# Patient Record
Sex: Female | Born: 1950 | Race: White | Hispanic: No | Marital: Single | Smoking: Former smoker
Health system: Southern US, Community
[De-identification: ages and names within clinical notes are randomized; demographics above are authoritative.]

## PROBLEM LIST (undated history)

## (undated) DIAGNOSIS — F329 Major depressive disorder, single episode, unspecified: Secondary | ICD-10-CM

## (undated) DIAGNOSIS — F32A Depression, unspecified: Secondary | ICD-10-CM

## (undated) DIAGNOSIS — E785 Hyperlipidemia, unspecified: Secondary | ICD-10-CM

## (undated) DIAGNOSIS — I10 Essential (primary) hypertension: Secondary | ICD-10-CM

## (undated) DIAGNOSIS — R569 Unspecified convulsions: Secondary | ICD-10-CM

## (undated) DIAGNOSIS — G4700 Insomnia, unspecified: Secondary | ICD-10-CM

## (undated) HISTORY — PX: ABDOMINAL HYSTERECTOMY: SHX81

## (undated) HISTORY — PX: BUNIONECTOMY: SHX129

## (undated) HISTORY — PX: ANEURYSM COILING: SHX5349

## (undated) HISTORY — DX: Insomnia, unspecified: G47.00

## (undated) HISTORY — DX: Depression, unspecified: F32.A

## (undated) HISTORY — DX: Major depressive disorder, single episode, unspecified: F32.9

## (undated) HISTORY — PX: TRIGGER FINGER RELEASE: SHX641

---

## 2013-07-27 ENCOUNTER — Encounter: Payer: Self-pay | Admitting: Emergency Medicine

## 2013-07-27 ENCOUNTER — Emergency Department
Admission: EM | Admit: 2013-07-27 | Discharge: 2013-07-27 | Disposition: A | Discharge status: Home / Self Care | Payer: Self-pay | Source: Home / Self Care

## 2013-07-27 DIAGNOSIS — I10 Essential (primary) hypertension: Secondary | ICD-10-CM

## 2013-07-27 DIAGNOSIS — G40909 Epilepsy, unspecified, not intractable, without status epilepticus: Secondary | ICD-10-CM

## 2013-07-27 DIAGNOSIS — E785 Hyperlipidemia, unspecified: Secondary | ICD-10-CM

## 2013-07-27 HISTORY — DX: Essential (primary) hypertension: I10

## 2013-07-27 HISTORY — DX: Hyperlipidemia, unspecified: E78.5

## 2013-07-27 HISTORY — DX: Unspecified convulsions: R56.9

## 2013-07-27 LAB — COMPREHENSIVE METABOLIC PANEL
ALT: 24 U/L (ref 0–35)
Albumin: 4.5 g/dL (ref 3.5–5.2)
BUN: 14 mg/dL (ref 6–23)
CO2: 29 mEq/L (ref 19–32)
Calcium: 10.1 mg/dL (ref 8.4–10.5)
Chloride: 101 mEq/L (ref 96–112)
Glucose, Bld: 84 mg/dL (ref 70–99)
Potassium: 4.7 mEq/L (ref 3.5–5.3)
Sodium: 139 mEq/L (ref 135–145)
Total Bilirubin: 0.6 mg/dL (ref 0.3–1.2)
Total Protein: 7.8 g/dL (ref 6.0–8.3)

## 2013-07-27 LAB — TSH: TSH: 2.719 u[IU]/mL (ref 0.350–4.500)

## 2013-07-27 LAB — HEMOGLOBIN A1C
Hgb A1c MFr Bld: 6.4 % — ABNORMAL HIGH (ref <5.7)
Mean Plasma Glucose: 137 mg/dL — ABNORMAL HIGH (ref <117)

## 2013-07-27 LAB — LIPID PANEL
LDL Cholesterol: 80 mg/dL (ref 0–99)
Triglycerides: 190 mg/dL — ABNORMAL HIGH (ref <150)
VLDL: 38 mg/dL (ref 0–40)

## 2013-07-27 LAB — POCT CBC W AUTO DIFF (K'VILLE URGENT CARE)

## 2013-07-27 MED ORDER — TEMAZEPAM 30 MG PO CAPS: 30.0000 mg | ORAL_CAPSULE | Freq: Every evening | ORAL | Status: DC | PRN

## 2013-07-27 MED ORDER — BENAZEPRIL-HYDROCHLOROTHIAZIDE 5-6.25 MG PO TABS: 1.0000 | ORAL_TABLET | Freq: Every day | ORAL | Status: DC

## 2013-07-27 MED ORDER — LEVETIRACETAM 500 MG PO TABS: 500.0000 mg | ORAL_TABLET | Freq: Two times a day (BID) | ORAL | Status: DC

## 2013-07-27 MED ORDER — NEBIVOLOL HCL 10 MG PO TABS: 10.0000 mg | ORAL_TABLET | Freq: Every day | ORAL | Status: DC

## 2013-07-27 NOTE — ED Provider Notes (Signed)
CSN: [id number]     Arrival date & time 07/27/13  4098 History   None    Chief Complaint  Patient presents with  . Medication Refill    HPI  Patient presents here today for medication refills. Patient recently relocated to the area from [place]. Patient currently has an HMO that is not active in [place] and per the patient, she has to go to urgent care or ER for medication refills. She will get a new pain in January set up primary care at that point.   needs refills on her medicines through the end of December. Baseline history of hypertension, hyperlipidemia, seizure disorder. [place] chest pain shortness of breath. Does have some intermittent mild headaches. Seizure diagnosis was about one year ago via outpatient EEG by neurology and [place]. Has been on Keppra for this. Has not missed any doses. No seizure activity since the initial seizure last year. 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   Past Medical History  Diagnosis Date  . Hypertension   . Hyperlipidemia   . Seizure    Past Surgical History  Procedure Laterality Date  . Abdominal hysterectomy    . Aneurysm coiling     Family History  Problem Relation Age of Onset  . Heart failure Mother   . Transient ischemic attack Mother   . Heart failure Father   . Diabetes Father    History  Substance Use Topics  . Smoking status: Former Games developer  . Smokeless tobacco: Not on file  . Alcohol Use: No   OB History   Grav Para Term Preterm Abortions TAB SAB Ect Mult Living                 Review of Systems  All other systems reviewed and are negative.    Allergies  Review of patient's allergies indicates no known allergies.  Home Medications   Current Outpatient Rx  Name  Route  Sig  Dispense  Refill  . benazepril-hydrochlorthiazide (LOTENSIN HCT) 5-6.25 MG per tablet   Oral   Take 1 tablet by mouth daily.   60 tablet   0   . levETIRAcetam  (KEPPRA) 500 MG tablet   Oral   Take 1 tablet (500 mg total) by mouth 2 (two) times daily.   60 tablet   1   . nebivolol (BYSTOLIC) 10 MG tablet   Oral   Take 1 tablet (10 mg total) by mouth daily.   30 tablet   1   . temazepam (RESTORIL) 30 MG capsule   Oral   Take 1 capsule (30 mg total) by mouth at bedtime as needed for sleep.   30 capsule   0    BP 189/82  Pulse 70  Temp(Src) 98.1 F (36.7 C) (Oral)  Resp 18  Ht 5\' 6"  (1.676 m)  Wt 233 lb (105.688 kg)  BMI 37.63 kg/m2  SpO2 96% Physical Exam  Constitutional: She is oriented to person, place, and time.  Obese   HENT:  Head: Normocephalic and atraumatic.  Eyes: Conjunctivae are normal. Pupils are equal, round, and reactive to light.  Neck: Normal range of motion.  Cardiovascular: Normal rate and regular rhythm.   Pulmonary/Chest: Effort normal and breath sounds normal.  Abdominal: Soft.  Musculoskeletal: Normal range of motion.  Neurological: She is alert and oriented to person, place, and time.  Skin: Skin is warm.    ED Course  Procedures (including  critical care time) Labs Review Labs Reviewed  COMPREHENSIVE METABOLIC PANEL  TSH  HEMOGLOBIN A1C  LIPID PANEL  LEVETIRACETAM LEVEL  POCT CBC W AUTO DIFF ([facility])   Imaging Review No results found.  EKG Interpretation    Date/Time:    Ventricular Rate:    PR Interval:    QRS Duration:   QT Interval:    QTC Calculation:   R Axis:     Text Interpretation:              MDM   1. HTN (hypertension)   2. HLD (hyperlipidemia)   3. Seizure disorder    Medications refilled today.  Will double dose of benazapril-HCTZ given BP.  Check baseline labs including CBC, CMET, A1C, TSH, lipid panel.  Also check keppra level.  Discussed neurovascular red flags at length with pt.  Set up primary care at beginning of the year.  Check BP at home.  Go to ER if pt develops any significant neurovascular sxs.      The patient and/or  caregiver has been counseled thoroughly with regard to treatment plan and/or medications prescribed including dosage, schedule, interactions, rationale for use, and possible side effects and they verbalize understanding. Diagnoses and expected course of recovery discussed and will return if not improved as expected or if the condition worsens. Patient and/or caregiver verbalized understanding.          Doree Albee, MD 07/27/13 1032

## 2013-07-27 NOTE — ED Notes (Signed)
The pt is here today for a refill of her medications. She is going to schedule an appt to establish a PCP, she recently moved here from [place].

## 2013-09-13 ENCOUNTER — Ambulatory Visit (INDEPENDENT_AMBULATORY_CARE_PROVIDER_SITE_OTHER): Payer: BC Managed Care – PPO | Admitting: Physician Assistant

## 2013-09-13 ENCOUNTER — Encounter: Payer: Self-pay | Admitting: Physician Assistant

## 2013-09-13 VITALS — BP 138/90 | HR 92 | Ht 66.0 in | Wt 236.0 lb

## 2013-09-13 DIAGNOSIS — F32A Depression, unspecified: Secondary | ICD-10-CM | POA: Insufficient documentation

## 2013-09-13 DIAGNOSIS — N898 Other specified noninflammatory disorders of vagina: Secondary | ICD-10-CM

## 2013-09-13 DIAGNOSIS — G4700 Insomnia, unspecified: Secondary | ICD-10-CM

## 2013-09-13 DIAGNOSIS — R569 Unspecified convulsions: Secondary | ICD-10-CM

## 2013-09-13 DIAGNOSIS — J019 Acute sinusitis, unspecified: Secondary | ICD-10-CM

## 2013-09-13 DIAGNOSIS — E785 Hyperlipidemia, unspecified: Secondary | ICD-10-CM

## 2013-09-13 DIAGNOSIS — Z1239 Encounter for other screening for malignant neoplasm of breast: Secondary | ICD-10-CM

## 2013-09-13 DIAGNOSIS — F3289 Other specified depressive episodes: Secondary | ICD-10-CM

## 2013-09-13 DIAGNOSIS — F329 Major depressive disorder, single episode, unspecified: Secondary | ICD-10-CM

## 2013-09-13 DIAGNOSIS — I10 Essential (primary) hypertension: Secondary | ICD-10-CM

## 2013-09-13 LAB — WET PREP FOR TRICH, YEAST, CLUE
Trich, Wet Prep: NONE SEEN
YEAST WET PREP: NONE SEEN

## 2013-09-13 MED ORDER — DOXYCYCLINE HYCLATE 100 MG PO CAPS: 100.0000 mg | ORAL_CAPSULE | Freq: Two times a day (BID) | ORAL | Status: DC

## 2013-09-13 MED ORDER — BENAZEPRIL-HYDROCHLOROTHIAZIDE 10-12.5 MG PO TABS: 1.0000 | ORAL_TABLET | Freq: Every day | ORAL | Status: DC

## 2013-09-13 NOTE — Patient Instructions (Signed)
Wash Dial soap.   Hidradenitis Suppurativa, Sweat Gland Abscess Hidradenitis suppurativa is a long lasting (chronic), uncommon disease of the sweat glands. With this, boil-like lumps and scarring develop in the groin, some times under the arms (axillae), and under the breasts. It may also uncommonly occur behind the ears, in the crease of the buttocks, and around the genitals.  CAUSES  The cause is from a blocking of the sweat glands. They then become infected. It may cause drainage and odor. It is not contagious. So it cannot be given to someone else. It most often shows up in puberty (about 6410 to 61 years of age). But it may happen much later. It is similar to acne which is a disease of the sweat glands. This condition is slightly more common in African-Americans and women. SYMPTOMS   Hidradenitis usually starts as one or more red, tender, swellings in the groin or under the arms (axilla).  Over a period of hours to days the lesions get larger. They often open to the skin surface, draining clear to yellow-colored fluid.  The infected area heals with scarring. DIAGNOSIS  Your caregiver makes this diagnosis by looking at you. Sometimes cultures (growing germs on plates in the lab) may be taken. This is to see what germ (bacterium) is causing the infection.  TREATMENT   Topical germ killing medicine applied to the skin (antibiotics) are the treatment of choice. Antibiotics taken by mouth (systemic) are sometimes needed when the condition is getting worse or is severe.  Avoid tight-fitting clothing which traps moisture in.  Dirt does not cause hidradenitis and it is not caused by poor hygiene.  Involved areas should be cleaned daily using an antibacterial soap. Some patients find that the liquid form of Lever 2000, applied to the involved areas as a lotion after bathing, can help reduce the odor related to this condition.  Sometimes surgery is needed to drain infected areas or remove scarred  tissue. Removal of large amounts of tissue is used only in severe cases.  Birth control pills may be helpful.  Oral retinoids (vitamin A derivatives) for 6 to 12 months which are effective for acne may also help this condition.  Weight loss will improve but not cure hidradenitis. It is made worse by being overweight. But the condition is not caused by being overweight.  This condition is more common in people who have had acne.  It may become worse under stress. There is no medical cure for hidradenitis. It can be controlled, but not cured. The condition usually continues for years with periods of getting worse and getting better (remission). Document Released: 04/07/2004 Document Revised: 11/16/2011 Document Reviewed: 04/23/2008 [facility] Patient Information 2014 [employer], [place].

## 2013-09-14 ENCOUNTER — Other Ambulatory Visit: Payer: Self-pay | Admitting: Physician Assistant

## 2013-09-14 MED ORDER — METRONIDAZOLE 500 MG PO TABS: 500.0000 mg | ORAL_TABLET | Freq: Two times a day (BID) | ORAL | Status: DC

## 2013-09-15 ENCOUNTER — Encounter: Payer: Self-pay | Admitting: Physician Assistant

## 2013-09-15 NOTE — Patient Instructions (Addendum)

## 2013-09-15 NOTE — Progress Notes (Signed)
   Subjective:    Patient ID: Hayley Porter, female    DOB: 12/29/1950, 63 y.o.   MRN: [id number]  HPI Pt is a 63 yo WF who presents to the clinic to establish care.  . Active Ambulatory Problems    Diagnosis Date Noted  . Insomnia 09/13/2013  . Depression 09/13/2013  . Seizures 09/13/2013  . Essential hypertension, benign 09/13/2013  . Hyperlipidemia 09/13/2013   Resolved Ambulatory Problems    Diagnosis Date Noted  . No Resolved Ambulatory Problems   Past Medical History  Diagnosis Date  . Hypertension   . Seizure    HTN- pt started on lotensin at urgent care. Denies any CP, palpitations, vision changes.   Depression- well controlled on zoloft.   Seizures- controlled on keppra needs new neurologist.   Hyperlipidemia- not had checked in a while. On crestor.   Insomnia- controlled on restoril.   Vaginal discharge for 3 days. Fish odor. No dysuria, urinary frequency or urgency. Some vaginal itching.  Sinus pressure ongoing for 1 month. Teeth ache. Negative for fever, chills, SOB, wheezing, nausea, vomiting. Not tried anything to make better. HA's have been more frequent. No cough.   Review of Systems  All other systems reviewed and are negative.       Objective:   Physical Exam  Constitutional: She is oriented to person, place, and time. She appears well-developed and well-nourished.  HENT:  Head: Normocephalic and atraumatic.  Eyes: Conjunctivae are normal.  Cardiovascular: Normal rate, regular rhythm and normal heart sounds.   Pulmonary/Chest: Effort normal and breath sounds normal. She has no wheezes.  No CVA tenderness.   Neurological: She is alert and oriented to person, place, and time.  Skin: Skin is warm and dry.  Psychiatric: She has a normal mood and affect. Her behavior is normal.          Assessment & Plan:  HTN- increased lotensin sent new dose to pharmacy. Follow up in 1-2 month for CPE and BP recheck. BP was better with 2nd check. Call if  becomes dizzy. Check BP and make sure staying under 140/90.   Hyperlipidemia- Will recheck Lipid level at CPE. Refilled crestor for time being.   Insomnia- refilled restoril.   Depression- Refilled zoloft.Depression screening- PHQ-9 was 5.    Seizures- will refer to neurologist for management.   Vaginal discharge- stat wet prep positive for BV. Treated with metronidzole. Gave HO.   Acute sinusitis- treated with doxycycline for 10 days. HO given. Call if not improving.   Needs mammogram.

## 2013-09-19 ENCOUNTER — Emergency Department
Admission: EM | Admit: 2013-09-19 | Discharge: 2013-09-19 | Disposition: A | Discharge status: Home / Self Care | Payer: BC Managed Care – PPO | Source: Home / Self Care | Attending: Emergency Medicine | Admitting: Emergency Medicine

## 2013-09-19 ENCOUNTER — Telehealth: Payer: Self-pay | Admitting: Physician Assistant

## 2013-09-19 ENCOUNTER — Encounter: Payer: Self-pay | Admitting: Emergency Medicine

## 2013-09-19 DIAGNOSIS — J0100 Acute maxillary sinusitis, unspecified: Secondary | ICD-10-CM

## 2013-09-19 MED ORDER — BENZONATATE 100 MG PO CAPS: 100.0000 mg | ORAL_CAPSULE | Freq: Three times a day (TID) | ORAL | Status: DC

## 2013-09-19 MED ORDER — AMOXICILLIN 875 MG PO TABS: ORAL_TABLET | ORAL | Status: DC

## 2013-09-19 MED ORDER — FLUTICASONE PROPIONATE 50 MCG/ACT NA SUSP: NASAL | Status: DC

## 2013-09-19 NOTE — ED Provider Notes (Signed)
CSN: [id number]     Arrival date & time 09/19/13  0932 History   First MD Initiated Contact with Patient 09/19/13 [phone]     Chief Complaint  Patient presents with  . URI  . Nasal Congestion  . Shortness of Breath   (Consider location/radiation/quality/duration/timing/severity/associated sxs/prior Treatment) HPI Jakyiah c/o congestion, runny nose, dry cough,  HA and sweats x 2-3 days. Taken Theraflu, robitussin and alkaseltzer plus otc. Received flu vac this season.      URI HISTORY  Jannifer is a 63 y.o. female who complains of onset of cold symptoms for 3 days.  Have been using over-the-counter treatment which helps a little bit.  mild chills/sweats +  Fever Mild body aches.--No arthralgias  +  Nasal congestion +  Discolored Post-nasal drainage, yellow-green, slight tinge blood Mild sinus pain/pressure No sore throat  +  Cough, nonproductive Denies wheezing No chest congestion No hemoptysis I questioned her further about breathing, and she denies shortness of breath No pleuritic pain  No itchy/red eyes No earache, but some bilateral ear pressure  No nausea No vomiting No abdominal pain No diarrhea  No skin rashes +  Fatigue mild myalgias Mild headache , denies focal neurologic symptoms or any visual change   Past Medical History  Diagnosis Date  . Hypertension   . Hyperlipidemia   . Seizure   . Insomnia   . Depression    Past Surgical History  Procedure Laterality Date  . Abdominal hysterectomy    . Aneurysm coiling    . Bunionectomy Right   . Trigger finger release Left     thumb   Family History  Problem Relation Age of Onset  . Heart failure Mother   . Transient ischemic attack Mother   . Hypertension Mother   . Hyperlipidemia Mother   . Heart failure Father   . Diabetes Father    History  Substance Use Topics  . Smoking status: Former Smoker    Quit date: 09/19/1993  . Smokeless tobacco: Never Used  . Alcohol Use: No   OB History   Grav Para Term Preterm Abortions TAB SAB Ect Mult Living                 Review of Systems  All other systems reviewed and are negative.    Allergies  Review of patient's allergies indicates no known allergies.  Home Medications   Current Outpatient Rx  Name  Route  Sig  Dispense  Refill  . amoxicillin (AMOXIL) 875 MG tablet      Take 1 twice a day X 10 days.   20 tablet   0   . aspirin 81 MG tablet   Oral   Take 81 mg by mouth daily.         . benazepril-hydrochlorthiazide (LOTENSIN HCT) 10-12.5 MG per tablet   Oral   Take 1 tablet by mouth daily.   90 tablet   0   . benzonatate (TESSALON) 100 MG capsule   Oral   Take 1 capsule (100 mg total) by mouth every 8 (eight) hours. As needed for cough   21 capsule   0   . doxycycline (VIBRAMYCIN) 100 MG capsule   Oral   Take 1 capsule (100 mg total) by mouth 2 (two) times daily. Take for 10 days.   20 capsule   0   . levETIRAcetam (KEPPRA) 500 MG tablet   Oral   Take 1 tablet (500 mg total) by mouth 2 (two) times  daily.   60 tablet   1   . metroNIDAZOLE (FLAGYL) 500 MG tablet   Oral   Take 1 tablet (500 mg total) by mouth 2 (two) times daily. For 7 days.   14 tablet   0   . Multiple Vitamin (MULTIVITAMIN) tablet   Oral   Take 1 tablet by mouth daily.         . nebivolol (BYSTOLIC) 10 MG tablet   Oral   Take 1 tablet (10 mg total) by mouth daily.   30 tablet   1   . rosuvastatin (CRESTOR) 10 MG tablet   Oral   Take 10 mg by mouth daily.         . sertraline (ZOLOFT) 100 MG tablet   Oral   Take 100 mg by mouth daily.         . temazepam (RESTORIL) 30 MG capsule   Oral   Take 1 capsule (30 mg total) by mouth at bedtime as needed for sleep.   30 capsule   0   . Vitamin D, Ergocalciferol, (DRISDOL) 50000 UNITS CAPS capsule   Oral   Take 50,000 Units by mouth every 30 (thirty) days.          BP 174/93  Pulse 97  Temp(Src) 99.4 F (37.4 C) (Oral)  Resp 14  Wt 238 lb (107.956 kg)   SpO2 95% Physical Exam  Nursing note and vitals reviewed. Constitutional: She is oriented to person, place, and time. She appears well-developed and well-nourished. No distress.  HENT:  Head: Normocephalic and atraumatic.  Right Ear: Tympanic membrane, external ear and ear canal normal.  Left Ear: Tympanic membrane, external ear and ear canal normal.  Nose: Mucosal edema and rhinorrhea present. Right sinus exhibits maxillary sinus tenderness. Left sinus exhibits maxillary sinus tenderness.  Mouth/Throat: Oropharynx is clear and moist. No oral lesions. No oropharyngeal exudate.  Eyes: Right eye exhibits no discharge. Left eye exhibits no discharge. No scleral icterus.  Neck: Neck supple.  Cardiovascular: Normal rate, regular rhythm and normal heart sounds.   Pulmonary/Chest: Effort normal and breath sounds normal. She has no wheezes. She has no rales.  Lymphadenopathy:    She has no cervical adenopathy.  Neurological: She is alert and oriented to person, place, and time.  Skin: Skin is warm and dry.    ED Course  Procedures (including critical care time) Labs Review Labs Reviewed - No data to display Imaging Review No results found.  EKG Interpretation    Date/Time:    Ventricular Rate:    PR Interval:    QRS Duration:   QT Interval:    QTC Calculation:   R Axis:     Text Interpretation:              MDM   1. Acute maxillary sinusitis    Treatment options discussed, as well as risks, benefits, alternatives. Patient voiced understanding and agreement with the following plans:  Amoxicillin 875 twice a day x10 days Tessalon Perles every 8 hours as needed for cough Plain OTC Mucinex, advised to avoid decongestants which can raise BP. Flonase prescribed Follow-up with your primary care doctor in 5-7 days if not improving, or sooner if symptoms become worse. Also advised to followup with PCP within 2 weeks to have BP rechecked. Precautions discussed. Red flags  discussed. Questions invited and answered. Patient voiced understanding and agreement.     Lajean Manesavid Massey, MD 09/19/13 1031

## 2013-09-19 NOTE — Telephone Encounter (Signed)
Call pt: I see that she went to urgent care today and got augmentin. I had put her on doxy when she was here last Wednesday. Make sure not taking both.

## 2013-09-19 NOTE — ED Notes (Signed)
Hayley Porter c/o congestion, runny nose, dry cough, SOB @ rest, HA and sweats x 2-3 days. Taken Theraflu, robitussin and alkaseltzer plus otc. Received flu vac this season.

## 2013-09-20 NOTE — Telephone Encounter (Signed)
LMOM for pt to return call. 

## 2013-09-26 ENCOUNTER — Ambulatory Visit: Payer: BC Managed Care – PPO

## 2013-10-02 ENCOUNTER — Other Ambulatory Visit: Payer: Self-pay | Admitting: Family Medicine

## 2013-10-03 ENCOUNTER — Ambulatory Visit (INDEPENDENT_AMBULATORY_CARE_PROVIDER_SITE_OTHER): Payer: BC Managed Care – PPO

## 2013-10-03 ENCOUNTER — Other Ambulatory Visit: Payer: Self-pay | Admitting: *Deleted

## 2013-10-03 DIAGNOSIS — R928 Other abnormal and inconclusive findings on diagnostic imaging of breast: Secondary | ICD-10-CM

## 2013-10-03 DIAGNOSIS — Z1231 Encounter for screening mammogram for malignant neoplasm of breast: Secondary | ICD-10-CM

## 2013-10-03 MED ORDER — LEVETIRACETAM 500 MG PO TABS: 500.0000 mg | ORAL_TABLET | Freq: Two times a day (BID) | ORAL | Status: DC

## 2013-10-03 NOTE — Telephone Encounter (Signed)
#  60 of Keppra sent with no refills to [facility].

## 2013-10-03 NOTE — Telephone Encounter (Signed)
Patient walked-in request to have a refill of Levetiracetam 500mg  [facility] [place]. Alvester Mor[place] in urgent care filled it for her but she is completely out and need Jade to refill it. Thanks

## 2013-10-09 ENCOUNTER — Encounter: Payer: Self-pay | Admitting: Physician Assistant

## 2013-10-10 ENCOUNTER — Encounter: Payer: Self-pay | Admitting: Physician Assistant

## 2013-10-10 DIAGNOSIS — R928 Other abnormal and inconclusive findings on diagnostic imaging of breast: Secondary | ICD-10-CM | POA: Insufficient documentation

## 2013-10-16 ENCOUNTER — Other Ambulatory Visit: Payer: Self-pay | Admitting: Physician Assistant

## 2013-10-16 ENCOUNTER — Other Ambulatory Visit: Payer: Self-pay | Admitting: *Deleted

## 2013-10-16 MED ORDER — TEMAZEPAM 30 MG PO CAPS: 30.0000 mg | ORAL_CAPSULE | Freq: Every evening | ORAL | Status: DC | PRN

## 2013-10-16 MED ORDER — NEBIVOLOL HCL 10 MG PO TABS: 10.0000 mg | ORAL_TABLET | Freq: Every day | ORAL | Status: DC

## 2013-10-18 ENCOUNTER — Other Ambulatory Visit: Payer: Self-pay | Admitting: Physician Assistant

## 2013-10-18 DIAGNOSIS — R928 Other abnormal and inconclusive findings on diagnostic imaging of breast: Secondary | ICD-10-CM

## 2013-10-30 ENCOUNTER — Ambulatory Visit
Admission: RE | Admit: 2013-10-30 | Discharge: 2013-10-30 | Disposition: A | Discharge status: Home / Self Care | Payer: BC Managed Care – PPO | Source: Ambulatory Visit | Attending: Physician Assistant | Admitting: Physician Assistant

## 2013-10-30 DIAGNOSIS — R928 Other abnormal and inconclusive findings on diagnostic imaging of breast: Secondary | ICD-10-CM

## 2013-10-31 ENCOUNTER — Ambulatory Visit: Payer: BC Managed Care – PPO | Admitting: Physician Assistant

## 2013-11-01 ENCOUNTER — Ambulatory Visit (INDEPENDENT_AMBULATORY_CARE_PROVIDER_SITE_OTHER): Payer: BC Managed Care – PPO | Admitting: Physician Assistant

## 2013-11-01 ENCOUNTER — Encounter: Payer: Self-pay | Admitting: Physician Assistant

## 2013-11-01 VITALS — BP 187/87 | HR 80 | Temp 98.3°F | Wt 237.0 lb

## 2013-11-01 DIAGNOSIS — R05 Cough: Secondary | ICD-10-CM

## 2013-11-01 DIAGNOSIS — R062 Wheezing: Secondary | ICD-10-CM

## 2013-11-01 DIAGNOSIS — J329 Chronic sinusitis, unspecified: Secondary | ICD-10-CM

## 2013-11-01 DIAGNOSIS — I10 Essential (primary) hypertension: Secondary | ICD-10-CM

## 2013-11-01 DIAGNOSIS — R059 Cough, unspecified: Secondary | ICD-10-CM

## 2013-11-01 DIAGNOSIS — J45909 Unspecified asthma, uncomplicated: Secondary | ICD-10-CM

## 2013-11-01 MED ORDER — ALBUTEROL SULFATE HFA 108 (90 BASE) MCG/ACT IN AERS: 2.0000 | INHALATION_SPRAY | Freq: Four times a day (QID) | RESPIRATORY_TRACT | Status: DC | PRN

## 2013-11-01 MED ORDER — IPRATROPIUM-ALBUTEROL 0.5-2.5 (3) MG/3ML IN SOLN
3.0000 mL | Freq: Once | RESPIRATORY_TRACT | Status: AC
  Administered 2013-11-01: 3 mL via RESPIRATORY_TRACT

## 2013-11-01 MED ORDER — PREDNISONE 50 MG PO TABS: ORAL_TABLET | ORAL | Status: DC

## 2013-11-01 MED ORDER — AZITHROMYCIN 250 MG PO TABS: ORAL_TABLET | ORAL | Status: DC

## 2013-11-01 NOTE — Patient Instructions (Addendum)
Increase Lotensin to 2 tabs daily of 10/12.5 to equal 20/25. Follow up month.   Acute Bronchitis Bronchitis is inflammation of the airways that extend from the windpipe into the lungs (bronchi). The inflammation often causes mucus to develop. This leads to a cough, which is the most common symptom of bronchitis.  In acute bronchitis, the condition usually develops suddenly and goes away over time, usually in a couple weeks. Smoking, allergies, and asthma can make bronchitis worse. Repeated episodes of bronchitis may cause further lung problems.  CAUSES Acute bronchitis is most often caused by the same virus that causes a cold. The virus can spread from person to person (contagious).  SIGNS AND SYMPTOMS   Cough.   Fever.   Coughing up mucus.   Body aches.   Chest congestion.   Chills.   Shortness of breath.   Sore throat.  DIAGNOSIS  Acute bronchitis is usually diagnosed through a physical exam. Tests, such as chest X-rays, are sometimes done to rule out other conditions.  TREATMENT  Acute bronchitis usually goes away in a couple weeks. Often times, no medical treatment is necessary. Medicines are sometimes given for relief of fever or cough. Antibiotics are usually not needed but may be prescribed in certain situations. In some cases, an inhaler may be recommended to help reduce shortness of breath and control the cough. A cool mist vaporizer may also be used to help thin bronchial secretions and make it easier to clear the chest.  HOME CARE INSTRUCTIONS  Get plenty of rest.   Drink enough fluids to keep your urine clear or pale yellow (unless you have a medical condition that requires fluid restriction). Increasing fluids may help thin your secretions and will prevent dehydration.   Only take over-the-counter or prescription medicines as directed by your health care provider.   Avoid smoking and secondhand smoke. Exposure to cigarette smoke or irritating chemicals will  make bronchitis worse. If you are a smoker, consider using nicotine gum or skin patches to help control withdrawal symptoms. Quitting smoking will help your lungs heal faster.   Reduce the chances of another bout of acute bronchitis by washing your hands frequently, avoiding people with cold symptoms, and trying not to touch your hands to your mouth, nose, or eyes.   Follow up with your health care provider as directed.  SEEK MEDICAL CARE IF: Your symptoms do not improve after 1 week of treatment.  SEEK IMMEDIATE MEDICAL CARE IF:  You develop an increased fever or chills.   You have chest pain.   You have severe shortness of breath.  You have bloody sputum.   You develop dehydration.  You develop fainting.  You develop repeated vomiting.  You develop a severe headache. MAKE SURE YOU:   Understand these instructions.  Will watch your condition.  Will get help right away if you are not doing well or get worse. Document Released: 10/01/2004 Document Revised: 04/26/2013 Document Reviewed: 02/14/2013 [facility] Patient Information 2014 [place], [place].

## 2013-11-01 NOTE — Progress Notes (Signed)
   Subjective:    Patient ID: Hayley Porter, female    DOB: 09/18/1950, 63 y.o.   MRN: [id number]  HPI Pt presents to the clinic with 4 days of HA, dry cough, sinus pressure, wheezing. Pt has no hx of lung disease or asthma. She denies any fever, chills, nausea, vomiting, diarrhea. She has tried [employer]. Donnelleygoody powders, tylenol, theraflu with no relief. Denies any body aches.    Review of Systems     Objective:   Physical Exam  Constitutional: She is oriented to person, place, and time. She appears well-developed and well-nourished.  HENT:  Head: Normocephalic and atraumatic.  Right Ear: External ear normal.  Left Ear: External ear normal.  Nose: Nose normal.  Mouth/Throat: Oropharynx is clear and moist.  TM's clear bilaterally.   Maxillary sinus pressure bilaterally.   Eyes: Conjunctivae are normal.  Neck: Normal range of motion. Neck supple.  Cardiovascular: Normal rate, regular rhythm and normal heart sounds.   Pulmonary/Chest:  Pulse ox 93 percent. Wheezing heard bilaterally before nebulizer.  After nebulizer Wheezing resolved with rhonchi heard bilaterally. Seemed to clear with cough.   Lymphadenopathy:    She has no cervical adenopathy.  Neurological: She is alert and oriented to person, place, and time.  Psychiatric: She has a normal mood and affect. Her behavior is normal.          Assessment & Plan:  Asthmatic bronchitis/sinusitis- peak flows in yellow and red zone. duoneb given in office today. Started on zpak and prednisone. Continue to use albuterol inhaler every 4-6 hours as needed for SOB/wheezing.flonase daily. Call if not improving.    HTN- BP not controlled today. Likely this could be in part due to coughing and illness. Nonetheless will Increase Lotensin to 2 tabs daily of 10/12.5 to equal 20/25. Follow up month.

## 2013-11-04 ENCOUNTER — Other Ambulatory Visit: Payer: Self-pay | Admitting: Physician Assistant

## 2013-11-07 ENCOUNTER — Other Ambulatory Visit: Payer: Self-pay | Admitting: Physician Assistant

## 2013-11-07 ENCOUNTER — Telehealth: Payer: Self-pay | Admitting: *Deleted

## 2013-11-07 NOTE — Telephone Encounter (Signed)
Make sure using albuterol every 4 hours for wheezing and SOB. Is she taking prednisone? Prednisone should be helping to keep wheezing down. If she continues to wheeze I need to listen to her lungs again.

## 2013-11-07 NOTE — Telephone Encounter (Signed)
Pt states that she is using the flonase.  She states that she is also still wheezing.

## 2013-11-07 NOTE — Telephone Encounter (Signed)
Pt left message today stating that she is still feeling bad; still has a stuffy head.  She is wanting to know if she may need some more meds or if she needs to come back in. Please advise.

## 2013-11-07 NOTE — Telephone Encounter (Signed)
We could send another nasal spray or consider decongestant OTC. If sinus pressure continues need to get CT of sinuses. Is she using flonase.

## 2013-11-07 NOTE — Telephone Encounter (Signed)
Pt still wheezing after albuterol & finishing prednisone.  Sent to scheduling.

## 2013-11-08 ENCOUNTER — Encounter: Payer: Self-pay | Admitting: Physician Assistant

## 2013-11-08 ENCOUNTER — Ambulatory Visit (INDEPENDENT_AMBULATORY_CARE_PROVIDER_SITE_OTHER): Payer: BC Managed Care – PPO | Admitting: Physician Assistant

## 2013-11-08 ENCOUNTER — Ambulatory Visit (INDEPENDENT_AMBULATORY_CARE_PROVIDER_SITE_OTHER): Payer: BC Managed Care – PPO

## 2013-11-08 VITALS — BP 175/84 | HR 81 | Temp 98.0°F | Wt 237.0 lb

## 2013-11-08 DIAGNOSIS — I517 Cardiomegaly: Secondary | ICD-10-CM

## 2013-11-08 DIAGNOSIS — I10 Essential (primary) hypertension: Secondary | ICD-10-CM

## 2013-11-08 DIAGNOSIS — R05 Cough: Secondary | ICD-10-CM

## 2013-11-08 DIAGNOSIS — R059 Cough, unspecified: Secondary | ICD-10-CM

## 2013-11-08 DIAGNOSIS — R062 Wheezing: Secondary | ICD-10-CM

## 2013-11-08 DIAGNOSIS — J45909 Unspecified asthma, uncomplicated: Secondary | ICD-10-CM

## 2013-11-08 MED ORDER — BENZONATATE 200 MG PO CAPS: 200.0000 mg | ORAL_CAPSULE | Freq: Two times a day (BID) | ORAL | Status: DC | PRN

## 2013-11-08 MED ORDER — BECLOMETHASONE DIPROPIONATE 80 MCG/ACT NA AERS: INHALATION_SPRAY | NASAL | Status: DC

## 2013-11-08 MED ORDER — FLUTICASONE PROPIONATE HFA 110 MCG/ACT IN AERO: 2.0000 | INHALATION_SPRAY | Freq: Two times a day (BID) | RESPIRATORY_TRACT | Status: DC

## 2013-11-08 MED ORDER — METHYLPREDNISOLONE ACETATE 80 MG/ML IJ SUSP
80.0000 mg | Freq: Once | INTRAMUSCULAR | Status: AC
  Administered 2013-11-08: 80 mg via INTRAMUSCULAR

## 2013-11-08 MED ORDER — IPRATROPIUM-ALBUTEROL 0.5-2.5 (3) MG/3ML IN SOLN
3.0000 mL | Freq: Once | RESPIRATORY_TRACT | Status: AC
  Administered 2013-11-08: 3 mL via RESPIRATORY_TRACT

## 2013-11-08 NOTE — Progress Notes (Signed)
   Subjective:    Patient ID: Hayley Porter, female    DOB: 12/29/1950, 63 y.o.   MRN: [id number]  HPI Patient is a 63 yo female who presents to the clinic with continual wheezing and dry cough. Seen last week with asthmatic bronchitis and given zpak and prednisone burst. She has been using albuterol at least 3 times a day and cannot seem to feel better. Her left ear hurts and she feels some pressure into her throat. Denies any fever, nausea. She just feels wheezy and coughing throughout day. Worse at night. No hx of lung disease.     Review of Systems     Objective:   Physical Exam  Constitutional: She is oriented to person, place, and time. She appears well-developed and well-nourished.  HENT:  Head: Normocephalic and atraumatic.  Right Ear: External ear normal.  Left Ear: External ear normal.  Nose: Nose normal.  Mouth/Throat: Oropharynx is clear and moist.  Bilateral TM's injected.   Eyes: Conjunctivae are normal. Right eye exhibits no discharge. Left eye exhibits no discharge.  Neck: Normal range of motion. Neck supple.  Cardiovascular: Normal rate, regular rhythm and normal heart sounds.   Pulmonary/Chest: Effort normal and breath sounds normal. She has no wheezes.  Lymphadenopathy:    She has no cervical adenopathy.  Neurological: She is alert and oriented to person, place, and time.  Skin: Skin is dry.  Psychiatric: She has a normal mood and affect. Her behavior is normal.          Assessment & Plan:  Asthmatic bronchitis- peak flows continue to be in yellow. Duoneb given in office today. Encouraged pt to use every 4-6 hours as needed albuterol. Gave samples of flovent to start daily 2 puffs BID. Will also switch flonase to qnasl daily. Shot of depo medrol 80mg  was given. Sent for CXR. Pt has not in the past had problems with asthma. Will evaluate need to stay on flovent. Tessalon pearls given for cough. Start zyrtec dailhy. Follow up in 1 month.   HTN- will follow up at  [facility] this month. Not sure how much of this is due to coughing.

## 2013-11-08 NOTE — Patient Instructions (Signed)
Start Flovent 2 puffs twice a day.  Replace Flonase with qnasl daily.  Will get CXR.   F/U in one month.

## 2013-11-13 ENCOUNTER — Encounter: Payer: BC Managed Care – PPO | Admitting: Physician Assistant

## 2013-11-15 ENCOUNTER — Encounter: Payer: BC Managed Care – PPO | Admitting: Physician Assistant

## 2013-11-24 ENCOUNTER — Ambulatory Visit (INDEPENDENT_AMBULATORY_CARE_PROVIDER_SITE_OTHER): Payer: BC Managed Care – PPO | Admitting: Physician Assistant

## 2013-11-24 ENCOUNTER — Encounter: Payer: Self-pay | Admitting: Physician Assistant

## 2013-11-24 VITALS — BP 172/79 | HR 76 | Ht 66.0 in | Wt 236.0 lb

## 2013-11-24 DIAGNOSIS — J45909 Unspecified asthma, uncomplicated: Secondary | ICD-10-CM | POA: Insufficient documentation

## 2013-11-24 DIAGNOSIS — Z23 Encounter for immunization: Secondary | ICD-10-CM

## 2013-11-24 DIAGNOSIS — I10 Essential (primary) hypertension: Secondary | ICD-10-CM

## 2013-11-24 DIAGNOSIS — Z0000 Encounter for general adult medical examination without abnormal findings: Secondary | ICD-10-CM

## 2013-11-24 MED ORDER — SERTRALINE HCL 100 MG PO TABS: 100.0000 mg | ORAL_TABLET | Freq: Every day | ORAL | Status: DC

## 2013-11-24 MED ORDER — ROSUVASTATIN CALCIUM 10 MG PO TABS: 10.0000 mg | ORAL_TABLET | Freq: Every day | ORAL | Status: DC

## 2013-11-24 MED ORDER — FLUTICASONE PROPIONATE HFA 110 MCG/ACT IN AERO: 2.0000 | INHALATION_SPRAY | Freq: Two times a day (BID) | RESPIRATORY_TRACT | Status: DC

## 2013-11-24 MED ORDER — NEBIVOLOL HCL 10 MG PO TABS: 10.0000 mg | ORAL_TABLET | Freq: Every day | ORAL | Status: DC

## 2013-11-24 MED ORDER — BECLOMETHASONE DIPROPIONATE 80 MCG/ACT NA AERS: INHALATION_SPRAY | NASAL | Status: DC

## 2013-11-24 MED ORDER — OMEPRAZOLE 40 MG PO CPDR: 40.0000 mg | DELAYED_RELEASE_CAPSULE | Freq: Every day | ORAL | Status: DC

## 2013-11-24 MED ORDER — MONTELUKAST SODIUM 10 MG PO TABS: 10.0000 mg | ORAL_TABLET | Freq: Every day | ORAL | Status: DC

## 2013-11-24 MED ORDER — TEMAZEPAM 30 MG PO CAPS: 30.0000 mg | ORAL_CAPSULE | Freq: Every evening | ORAL | Status: DC | PRN

## 2013-11-24 MED ORDER — VITAMIN D (ERGOCALCIFEROL) 1.25 MG (50000 UNIT) PO CAPS: 50000.0000 [IU] | ORAL_CAPSULE | ORAL | Status: AC

## 2013-11-24 MED ORDER — OLMESARTAN-AMLODIPINE-HCTZ 40-5-25 MG PO TABS: ORAL_TABLET | ORAL | Status: DC

## 2013-11-24 NOTE — Patient Instructions (Addendum)
Will give shingles vaccine today and in 4weeks can come back for Tdap.   Stop lotensin. Start tribenzor daily. Follow up in 1 month for BP recheck and lab work.   Keeping You Healthy  Get These Tests  Blood Pressure- Have your blood pressure checked by your healthcare provider at least once a year.  Normal blood pressure is 120/80.  Weight- Have your body mass index (BMI) calculated to screen for obesity.  BMI is a measure of body fat based on height and weight.  You can calculate your own BMI at https://www.west-esparza.com/www.nhlbisupport.com/bmi/  Cholesterol- Have your cholesterol checked every year.  Diabetes- Have your blood sugar checked every year if you have high blood pressure, high cholesterol, a family history of diabetes or if you are overweight.  Pap Smear- Have a pap smear every 1 to 3 years if you have been sexually active.  If you are older than 65 and recent pap smears have been normal you may not need additional pap smears.  In addition, if you have had a hysterectomy  For benign disease additional pap smears are not necessary.  Mammogram-Yearly mammograms are essential for early detection of breast cancer  Screening for Colon Cancer- Colonoscopy starting at age 61. Screening may begin sooner depending on your family history and other health conditions.  Follow up colonoscopy as directed by your Gastroenterologist.  Screening for Osteoporosis- Screening begins at age 61 with bone density scanning, sooner if you are at higher risk for developing Osteoporosis.  Get these medicines  Calcium with Vitamin D- Your body requires 1200-1500 mg of Calcium a day and [phone] IU of Vitamin D a day.  You can only absorb 500 mg of Calcium at a time therefore Calcium must be taken in 2 or 3 separate doses throughout the day.  Hormones- Hormone therapy has been associated with increased risk for certain cancers and heart disease.  Talk to your healthcare provider about if you need relief from menopausal  symptoms.  Aspirin- Ask your healthcare provider about taking Aspirin to prevent Heart Disease and Stroke.  Get these Immuniztions  Flu shot- Every fall  Pneumonia shot- Once after the age of 61; if you are younger ask your healthcare provider if you need a pneumonia shot.  Tetanus- Every ten years.  Zostavax- Once after the age of 61 to prevent shingles.  Take these steps  Don't smoke- Your healthcare provider can help you quit. For tips on how to quit, ask your healthcare provider or go to www.smokefree.gov or call 1-800 QUIT-NOW.  Be physically active- Exercise 5 days a week for a minimum of 30 minutes.  If you are not already physically active, start slow and gradually work up to 30 minutes of moderate physical activity.  Try walking, dancing, bike riding, swimming, etc.  Eat a healthy diet- Eat a variety of healthy foods such as fruits, vegetables, whole grains, low fat milk, low fat cheeses, yogurt, lean meats, chicken, fish, eggs, dried beans, tofu, etc.  For more information go to www.thenutritionsource.org  Dental visit- Brush and floss teeth twice daily; visit your dentist twice a year.  Eye exam- Visit your Optometrist or Ophthalmologist yearly.  Drink alcohol in moderation- Limit alcohol intake to one drink or less a day.  Never drink and drive.  Depression- Your emotional health is as important as your physical health.  If you're feeling down or losing interest in things you normally enjoy, please talk to your healthcare provider.  Seat Belts- can save your  life; always wear one  Smoke/Carbon Monoxide detectors- These detectors need to be installed on the appropriate level of your home.  Replace batteries at least once a year.  Violence- If anyone is threatening or hurting you, please tell your healthcare provider.  Living Will/ Health care power of attorney- Discuss with your healthcare provider and family.

## 2013-11-24 NOTE — Progress Notes (Signed)
  Subjective:     Hayley Porter is a 63 y.o. female and is here for a comprehensive physical exam. The patient reports no problems. she is here to follow up BP. increasded lotension to 20/25 and does not seem to be effecting BP. denies any CP, palpitations, SOB, headaches. .  History   Social History  . Marital Status: Single    Spouse Name: N/A    Number of Children: N/A  . Years of Education: N/A   Occupational History  . Not on file.   Social History Main Topics  . Smoking status: Former Smoker    Quit date: 09/19/1993  . Smokeless tobacco: Never Used  . Alcohol Use: No  . Drug Use: No  . Sexual Activity: No   Other Topics Concern  . Not on file   Social History Narrative  . No narrative on file   Health Maintenance  Topic Date Due  . Tetanus/tdap  01/08/1972  . Pap Smear  09/13/2038 (Originally 01/08/1971)  . Influenza Vaccine  04/07/2014  . Colonoscopy  09/08/2015  . Mammogram  10/31/2015  . Zostavax  Completed    The following portions of the patient's history were reviewed and updated as appropriate: allergies, current medications, past family history, past medical history, past social history, past surgical history and problem list.  Review of Systems A comprehensive review of systems was negative.   Objective:    BP 172/79  Pulse 76  Ht 5\' 6"  (1.676 m)  Wt 236 lb (107.049 kg)  BMI 38.11 kg/m2 General appearance: alert, cooperative, appears stated age and moderately obese Head: Normocephalic, without obvious abnormality, atraumatic Eyes: conjunctivae/corneas clear. PERRL, EOM's intact. Fundi benign. Ears: normal TM's and external ear canals both ears Nose: Nares normal. Septum midline. Mucosa normal. No drainage or sinus tenderness. Throat: lips, mucosa, and tongue normal; teeth and gums normal Neck: no adenopathy, no carotid bruit, no JVD, supple, symmetrical, trachea midline and thyroid not enlarged, symmetric, no tenderness/mass/nodules Back:  symmetric, no curvature. ROM normal. No CVA tenderness. Lungs: clear to auscultation bilaterally Heart: regular rate and rhythm, S1, S2 normal, no murmur, click, rub or gallop Abdomen: soft, non-tender; bowel sounds normal; no masses,  no organomegaly Extremities: extremities normal, atraumatic, no cyanosis or edema Pulses: 2+ and symmetric Skin: Skin color, texture, turgor normal. No rashes or lesions Lymph nodes: Cervical, supraclavicular, and axillary nodes normal. Neurologic: Grossly normal    Assessment:    Healthy female exam.      Plan:    CPE- Shingles vaccine given today. Will come back in 4 weeks for Tdap vaccine. Hysterectomy no need for pap. Colonoscopy up to date. Mammogram 10/2013 and normal.  Discussed calcium and vitamin D. Will recheck Vitamin D since continues to stay on high dose. Encouraged to include calcium. Will get CMP fasting in one month along with vitamin D. Pt does not want to get bloodwork done today and most have been drawn within 6 months.   HTN- continues to be out of control. Stop lotensin. Gave samples of tribenzor. Continue on bystolic.will recheck in 4 weeks.   Hyperlipidemia- checked within last year. Refilled crestor.   Depression- refilled zoloft for 6 months.  Insomnia- controlled refilled restoril for 6 months.    Asthma- continue on flovent, qnasl. Start singulair for spring. Can use in combination with zyrtec.  See After Visit Summary for Counseling Recommendations

## 2013-12-25 ENCOUNTER — Encounter: Payer: Self-pay | Admitting: Physician Assistant

## 2013-12-25 ENCOUNTER — Ambulatory Visit (INDEPENDENT_AMBULATORY_CARE_PROVIDER_SITE_OTHER): Payer: BC Managed Care – PPO | Admitting: Physician Assistant

## 2013-12-25 VITALS — BP 142/78 | HR 81 | Ht 66.0 in | Wt 239.0 lb

## 2013-12-25 DIAGNOSIS — Z23 Encounter for immunization: Secondary | ICD-10-CM

## 2013-12-25 DIAGNOSIS — N760 Acute vaginitis: Secondary | ICD-10-CM

## 2013-12-25 DIAGNOSIS — E669 Obesity, unspecified: Secondary | ICD-10-CM

## 2013-12-25 DIAGNOSIS — T148XXA Other injury of unspecified body region, initial encounter: Secondary | ICD-10-CM

## 2013-12-25 DIAGNOSIS — A499 Bacterial infection, unspecified: Secondary | ICD-10-CM

## 2013-12-25 DIAGNOSIS — I10 Essential (primary) hypertension: Secondary | ICD-10-CM

## 2013-12-25 DIAGNOSIS — B9689 Other specified bacterial agents as the cause of diseases classified elsewhere: Secondary | ICD-10-CM

## 2013-12-25 DIAGNOSIS — R635 Abnormal weight gain: Secondary | ICD-10-CM

## 2013-12-25 DIAGNOSIS — J45909 Unspecified asthma, uncomplicated: Secondary | ICD-10-CM

## 2013-12-25 MED ORDER — METRONIDAZOLE 500 MG PO TABS: 500.0000 mg | ORAL_TABLET | Freq: Two times a day (BID) | ORAL | Status: DC

## 2013-12-25 MED ORDER — PHENTERMINE-TOPIRAMATE ER 7.5-46 MG PO CP24: 1.0000 | ORAL_CAPSULE | Freq: Every day | ORAL | Status: DC

## 2013-12-25 MED ORDER — ALBUTEROL SULFATE HFA 108 (90 BASE) MCG/ACT IN AERS: 2.0000 | INHALATION_SPRAY | Freq: Four times a day (QID) | RESPIRATORY_TRACT | Status: AC | PRN

## 2013-12-25 MED ORDER — OLMESARTAN-AMLODIPINE-HCTZ 40-5-25 MG PO TABS: ORAL_TABLET | ORAL | Status: DC

## 2013-12-25 MED ORDER — PHENTERMINE-TOPIRAMATE ER 3.75-23 MG PO CP24: 1.0000 | ORAL_CAPSULE | Freq: Every morning | ORAL | Status: DC

## 2013-12-25 NOTE — Progress Notes (Signed)
   Subjective:    Patient ID: Hayley Porter, female    DOB: 06/14/1951, 63 y.o.   MRN: [id number]  HPI    Review of Systems     Objective:   Physical Exam  Constitutional: She is oriented to person, place, and time. She appears well-developed and well-nourished.  HENT:  Head: Normocephalic and atraumatic.  Cardiovascular: Normal rate, regular rhythm and normal heart sounds.   Pulmonary/Chest: Effort normal and breath sounds normal. She has no wheezes.  Abdominal:    Neurological: She is alert and oriented to person, place, and time.  Psychiatric: She has a normal mood and affect. Her behavior is normal.          Assessment & Plan:  HTN- rechecked BP and was great. Stay on same medication. Refilled tribenzor. Continue on bystolic. Follow up in 3 months.   Splinter in abdomen- removed with twiszer  in office today. Given bactroban sample to apply a couple of times.  Obesity/abnormal weight gain- Qsymia free sample was given with increased dosage to start daily. Discussed SE. Follow up in 2 months. Educated pt that diet and exercise are mainstay of weight loss. Encouraged 150 minutes of exercise weekly, decrease carbs, drink more water.   BV- pt has last episode in January. She did get better with treatment. 1 week ago discharge started again. Will treat with metronidazole. Discussed with pt diagnosis. She request referral to gyn.   Asthma- discussed proper ways to use inhalers. Albuterol as needed and flovent daily. Pt aware. Refilled albuterol. Follow up as needed or in 3 months.   Tdap was given without complications

## 2013-12-26 DIAGNOSIS — R635 Abnormal weight gain: Secondary | ICD-10-CM | POA: Insufficient documentation

## 2014-01-03 ENCOUNTER — Telehealth: Payer: Self-pay

## 2014-01-03 NOTE — Telephone Encounter (Signed)
Left message for patient to call office to schedule appt. Referral from [facility] office.

## 2014-01-08 ENCOUNTER — Encounter: Payer: Self-pay | Admitting: Physician Assistant

## 2014-01-08 ENCOUNTER — Ambulatory Visit (INDEPENDENT_AMBULATORY_CARE_PROVIDER_SITE_OTHER): Payer: BC Managed Care – PPO | Admitting: Physician Assistant

## 2014-01-08 VITALS — BP 141/73 | HR 68 | Wt 238.0 lb

## 2014-01-08 DIAGNOSIS — L919 Hypertrophic disorder of the skin, unspecified: Secondary | ICD-10-CM

## 2014-01-08 DIAGNOSIS — L909 Atrophic disorder of skin, unspecified: Secondary | ICD-10-CM

## 2014-01-08 DIAGNOSIS — L918 Other hypertrophic disorders of the skin: Secondary | ICD-10-CM

## 2014-01-08 NOTE — Progress Notes (Signed)
   Subjective:    Patient ID: Hayley Porter, female    DOB: 03/01/1951, 63 y.o.   MRN: [id number]  HPI Pt presents to the clinic for skin tag removal. Skin tags get irritated,hurt and bleed with wearing bras and necklace.    Review of Systems     Objective:   Physical Exam  Neck:    Pulmonary/Chest:            Assessment & Plan:  Irritated acrochordon to remove-   Skin Tag Removal Procedure Note  Pre-operative Diagnosis: Classic skin tags (acrochordon)  Post-operative Diagnosis: Classic skin tags (acrochordon)  Locations:along neck line, bilateral axilla, under left breast. Approximately 30 skin tags removed today.   Indications: irriated, hurt, bleed.   Anesthesia: none  Procedure Details  The risks (including bleeding and infection) and benefits of the procedure and Verbal informed consent obtained. Using sterile iris scissors, multiple skin tags were snipped off at their bases after cleansing with alchol.  Bleeding was controlled by pressure and aluminum chloride.   Findings: Pathognomonic benign lesions  not sent for pathological exam.  Condition: Stable  Complications: none.  Plan: 1. Instructed to keep the wounds dry and covered for 24-48h and clean thereafter. 2. Warning signs of infection were reviewed.   3. Recommended that the patient use OTC analgesics as needed for pain.  4. Return as needed.

## 2014-02-26 ENCOUNTER — Ambulatory Visit (INDEPENDENT_AMBULATORY_CARE_PROVIDER_SITE_OTHER): Payer: BC Managed Care – PPO | Admitting: Physician Assistant

## 2014-02-26 ENCOUNTER — Encounter: Payer: Self-pay | Admitting: Physician Assistant

## 2014-02-26 VITALS — BP 139/80 | HR 81 | Ht 66.0 in | Wt 238.0 lb

## 2014-02-26 DIAGNOSIS — E669 Obesity, unspecified: Secondary | ICD-10-CM

## 2014-02-26 DIAGNOSIS — L84 Corns and callosities: Secondary | ICD-10-CM

## 2014-02-26 NOTE — Patient Instructions (Signed)
Corns and Calluses Corns are small areas of thickened skin that usually occur on the top, sides, or tip of a toe. They contain a cone-shaped core with a point that can press on a nerve below. This causes pain. Calluses are areas of thickened skin that usually develop on hands, fingers, palms, soles of the feet, and heels. These are areas that experience frequent friction or pressure. CAUSES  Corns are usually the result of rubbing (friction) or pressure from shoes that are too tight or do not fit properly. Calluses are caused by repeated friction and pressure on the affected areas. SYMPTOMS  A hard growth on the skin.  Pain or tenderness under the skin.  Sometimes, redness and swelling.  Increased discomfort while wearing tight-fitting shoes. DIAGNOSIS  Your caregiver can usually tell what the problem is by doing a physical exam. TREATMENT  Removing the cause of the friction or pressure is usually the only treatment needed. However, sometimes medicines can be used to help soften the hardened, thickened areas. These medicines include salicylic acid plasters and 12% ammonium lactate lotion. These medicines should only be used under the direction of your caregiver. HOME CARE INSTRUCTIONS   Try to remove pressure from the affected area.  You may wear donut-shaped corn pads to protect your skin.  You may use a pumice stone or nonmetallic nail file to gently reduce the thickness of a corn.  Wear properly fitted footwear.  If you have calluses on the hands, wear gloves during activities that cause friction.  If you have diabetes, you should regularly examine your feet. Tell your caregiver if you notice any problems with your feet. SEEK IMMEDIATE MEDICAL CARE IF:   You have increased pain, swelling, redness, or warmth in the affected area.  Your corn or callus starts to drain fluid or bleeds.  You are not getting better, even with treatment. Document Released: 05/30/2004 Document  Revised: 11/16/2011 Document Reviewed: 04/21/2011 Exit[employer] Patient Information 2015 [employer]. This information is not intended to replace advice given to you by your health care provider. Make sure you discuss any questions you have with your health care provider.  

## 2014-02-26 NOTE — Progress Notes (Signed)
   Subjective:    Patient ID: Hayley Porter, female    DOB: 08/14/1951, 63 y.o.   MRN: [id number]  HPI Pt is a 63 yo female who presents to the clinic to follow up on qsymia but she stopped  Medication after reading side efffects. She just doesn't want to be on any medication that could potentially have that many side effects. She is trying to be more active and work on diet changes. No weight loss today.   Pt does have 2 growths on foot that rub in her shoe. They do carry some discomfort. Not tried anything to make better. Noticed for last 3-6 months.    Review of Systems  All other systems reviewed and are negative.      Objective:   Physical Exam  Constitutional: She is oriented to person, place, and time. She appears well-developed and well-nourished.  HENT:  Head: Normocephalic and atraumatic.  Cardiovascular: Normal rate, regular rhythm and normal heart sounds.   Pulmonary/Chest: Effort normal and breath sounds normal. She has no wheezes.  Musculoskeletal:       Feet:  Neurological: She is alert and oriented to person, place, and time.  Psychiatric: She has a normal mood and affect. Her behavior is normal.          Assessment & Plan:  Obesity/abnormal weight gain- discussed 1500 calorie diet and regular exercise. Suggested weight watchers. Follow up as needed.   Corn/callus- cryotherapy used today. Discussed care after. Follow up as needed. Encouraged pt to wear loose fitting shoes that do not rub.   Cryotherapy Procedure Note  Pre-operative Diagnosis: corn/callus  Post-operative Diagnosis: same  Locations: right last metatarsal top of toe and left lateral heel.   Indications: pain irritation.   Anesthesia: none  Procedure Details  History of allergy to iodine: no. Pacemaker? no.  Patient informed of risks (permanent scarring, infection, light or dark discoloration, bleeding, infection, weakness, numbness and recurrence of the lesion) and benefits of the  procedure and verbal informed consent obtained.  The areas are treated with liquid nitrogen therapy, frozen until ice ball extended 2 mm beyond lesion, allowed to thaw, and treated again. The patient tolerated procedure well.  The patient was instructed on post-op care, warned that there may be blister formation, redness and pain. Recommend OTC analgesia as needed for pain.  Condition: Stable  Complications: none.  Plan: 1. Instructed to keep the area dry and covered for 24-48h and clean thereafter. 2. Warning signs of infection were reviewed.   3. Recommended that the patient use OTC acetaminophen as needed for pain.

## 2014-04-27 ENCOUNTER — Ambulatory Visit (INDEPENDENT_AMBULATORY_CARE_PROVIDER_SITE_OTHER): Payer: BC Managed Care – PPO | Admitting: Physician Assistant

## 2014-04-27 ENCOUNTER — Encounter: Payer: Self-pay | Admitting: Physician Assistant

## 2014-04-27 VITALS — BP 137/67 | HR 89 | Temp 97.8°F | Ht 66.0 in | Wt 240.0 lb

## 2014-04-27 DIAGNOSIS — J019 Acute sinusitis, unspecified: Secondary | ICD-10-CM

## 2014-04-27 MED ORDER — AMOXICILLIN 500 MG PO CAPS: 500.0000 mg | ORAL_CAPSULE | Freq: Two times a day (BID) | ORAL | Status: DC

## 2014-04-27 NOTE — Patient Instructions (Signed)

## 2014-04-27 NOTE — Progress Notes (Signed)
   Subjective:    Patient ID: Hayley Porter, female    DOB: 08/01/1951, 63 y.o.   MRN: [id number]  HPI Pt presents to the clinic with sinus pressure for last 5 days. She feels like she is progressively getting worse. She denies fever, chills, n/v/d. She has used OTC decongestants and qnasal. Minimal relief. No ST. Ear pain is present in both ears without discharge. No cough.       Review of Systems  All other systems reviewed and are negative.      Objective:   Physical Exam  Constitutional: She is oriented to person, place, and time. She appears well-developed and well-nourished.  HENT:  Head: Normocephalic and atraumatic.  Eyes: Conjunctivae are normal.  Neck: Normal range of motion. Neck supple.  Cardiovascular: Normal rate, regular rhythm and normal heart sounds.   Pulmonary/Chest: Effort normal and breath sounds normal. She has no wheezes.  Lymphadenopathy:    She has no cervical adenopathy.  Neurological: She is alert and oriented to person, place, and time.  Skin: Skin is dry.  Psychiatric: She has a normal mood and affect. Her behavior is normal.          Assessment & Plan:  Acute sinusitis- amoxil given for 10 days. HO given. Continue qnasl. Call if not improving.

## 2014-05-03 ENCOUNTER — Telehealth: Payer: Self-pay | Admitting: *Deleted

## 2014-05-03 NOTE — Telephone Encounter (Signed)
Pt left vm stating that she isn't feeling any better since you gave her the amoxicillin.

## 2014-05-04 ENCOUNTER — Other Ambulatory Visit: Payer: Self-pay | Admitting: Physician Assistant

## 2014-05-04 MED ORDER — PREDNISONE 50 MG PO TABS: ORAL_TABLET | ORAL | Status: DC

## 2014-05-04 NOTE — Telephone Encounter (Signed)
Pt notified of prednisone rx.  I advised her to let us know if she wasn't feeling any better by Monday.

## 2014-05-04 NOTE — Telephone Encounter (Signed)
Continue on abx. Will send over burst of prednisone to see if helps residual inflammation.

## 2014-05-21 ENCOUNTER — Other Ambulatory Visit: Payer: Self-pay | Admitting: *Deleted

## 2014-05-21 MED ORDER — TEMAZEPAM 30 MG PO CAPS: 30.0000 mg | ORAL_CAPSULE | Freq: Every evening | ORAL | Status: DC | PRN

## 2014-06-14 ENCOUNTER — Other Ambulatory Visit: Payer: Self-pay | Admitting: Physician Assistant

## 2014-06-18 ENCOUNTER — Other Ambulatory Visit: Payer: Self-pay | Admitting: *Deleted

## 2014-06-18 MED ORDER — OLMESARTAN-AMLODIPINE-HCTZ 40-5-25 MG PO TABS: ORAL_TABLET | ORAL | Status: DC

## 2014-06-20 ENCOUNTER — Ambulatory Visit (INDEPENDENT_AMBULATORY_CARE_PROVIDER_SITE_OTHER): Payer: BC Managed Care – PPO | Admitting: Family Medicine

## 2014-06-20 VITALS — Temp 98.0°F

## 2014-06-20 DIAGNOSIS — Z23 Encounter for immunization: Secondary | ICD-10-CM

## 2014-06-20 NOTE — Progress Notes (Signed)
   Subjective:    Patient ID: Hayley Porter, female    DOB: 04/19/1951, 63 y.o.   MRN: [id number] Pt in for flu shot.  Given LD wit no complications. Donne AnonAmber Shya Kovatch, CMA HPI    Review of Systems     Objective:   Physical Exam        Assessment & Plan:

## 2014-07-03 ENCOUNTER — Encounter: Payer: Self-pay | Admitting: Physician Assistant

## 2014-07-03 DIAGNOSIS — G40219 Localization-related (focal) (partial) symptomatic epilepsy and epileptic syndromes with complex partial seizures, intractable, without status epilepticus: Secondary | ICD-10-CM | POA: Insufficient documentation

## 2014-07-11 ENCOUNTER — Other Ambulatory Visit: Payer: Self-pay

## 2014-07-11 MED ORDER — SERTRALINE HCL 100 MG PO TABS: 100.0000 mg | ORAL_TABLET | Freq: Every day | ORAL | Status: DC

## 2014-07-23 ENCOUNTER — Ambulatory Visit (INDEPENDENT_AMBULATORY_CARE_PROVIDER_SITE_OTHER): Payer: BC Managed Care – PPO | Admitting: Physician Assistant

## 2014-07-23 ENCOUNTER — Ambulatory Visit (INDEPENDENT_AMBULATORY_CARE_PROVIDER_SITE_OTHER): Payer: BC Managed Care – PPO

## 2014-07-23 ENCOUNTER — Encounter: Payer: Self-pay | Admitting: Physician Assistant

## 2014-07-23 VITALS — BP 157/74 | HR 83 | Ht 66.0 in | Wt 245.0 lb

## 2014-07-23 DIAGNOSIS — S92902A Unspecified fracture of left foot, initial encounter for closed fracture: Secondary | ICD-10-CM

## 2014-07-23 DIAGNOSIS — S99912A Unspecified injury of left ankle, initial encounter: Secondary | ICD-10-CM

## 2014-07-23 DIAGNOSIS — S92002A Unspecified fracture of left calcaneus, initial encounter for closed fracture: Secondary | ICD-10-CM

## 2014-07-23 DIAGNOSIS — W19XXXA Unspecified fall, initial encounter: Secondary | ICD-10-CM

## 2014-07-23 DIAGNOSIS — S92009A Unspecified fracture of unspecified calcaneus, initial encounter for closed fracture: Secondary | ICD-10-CM | POA: Insufficient documentation

## 2014-07-23 DIAGNOSIS — R609 Edema, unspecified: Secondary | ICD-10-CM

## 2014-07-23 MED ORDER — TRAMADOL HCL 50 MG PO TABS: 50.0000 mg | ORAL_TABLET | Freq: Three times a day (TID) | ORAL | Status: DC | PRN

## 2014-07-23 NOTE — Progress Notes (Signed)
   Subjective:    Patient ID: Hayley Porter, female    DOB: 11/22/1950, 63 y.o.   MRN: [id number]  HPI Patient is a 63 year old female who presents to the clinic after falling and twisting ankle on November 6 and 7th. From her best memory she feels like she rolled her ankle laterally.She continues to have persistent pain below her lateral malleolus that is made worse with pressure and walking. She has not tried any anti-inflammatories, ice or any treatment. She has taken a few Tylenol. She rates the pain as a 5 or 6 out of 10.   Review of Systems  All other systems reviewed and are negative.      Objective:   Physical Exam  Musculoskeletal:  Left lateral ankle with swelling but no bruising. Pain with palpation below lateral malleous but not over localized to the later foot.  Strength 4/5.  Pain with dorsi and plantar resistance.           Assessment & Plan:  Calcaneal avulsion fracture- xray confirmed. Placed in cam boot for 3 weeks. Reminded of no NSAIDs. Tramadol given for pain control. Patient encouraged to ice and elevate as needed. Follow-up with Dr. Karie Schwalbe in 3 weeks.

## 2014-08-13 ENCOUNTER — Encounter: Payer: Self-pay | Admitting: Sports Medicine

## 2014-08-13 ENCOUNTER — Ambulatory Visit (INDEPENDENT_AMBULATORY_CARE_PROVIDER_SITE_OTHER): Payer: BC Managed Care – PPO | Admitting: Sports Medicine

## 2014-08-13 VITALS — BP 156/74 | HR 60 | Ht 66.0 in | Wt 250.0 lb

## 2014-08-13 DIAGNOSIS — S92002A Unspecified fracture of left calcaneus, initial encounter for closed fracture: Secondary | ICD-10-CM | POA: Diagnosis not present

## 2014-08-13 MED ORDER — TRAMADOL HCL 50 MG PO TABS: 50.0000 mg | ORAL_TABLET | Freq: Three times a day (TID) | ORAL | Status: DC | PRN

## 2014-08-13 NOTE — Progress Notes (Signed)
   Subjective:    I'm seeing this patient as a consultation for:   Tandy GawJade Breeback, PA-C  CC:  Foot fracture  HPI:  this is a pleasant 63 year old female, several weeks ago she inverted her left foot and had immediate pain, swelling, bruising. She walked on it for approximately one month and then saw Jade, x-rays at the time showed a possible avulsion from the anterior process of the calcaneus , she was placed in a boot and referred to me for further evaluation and definitive treatment. Symptoms are mild, persistent localized over the dorsolateral midfoot.  Past medical history, Surgical history, Family history not pertinant except as noted below, Social history, Allergies, and medications have been entered into the medical record, reviewed, and no changes needed.   Review of Systems: No headache, visual changes, nausea, vomiting, diarrhea, constipation, dizziness, abdominal pain, skin rash, fevers, chills, night sweats, weight loss, swollen lymph nodes, body aches, joint swelling, muscle aches, chest pain, shortness of breath, mood changes, visual or auditory hallucinations.   Objective:   General: Well Developed, well nourished, and in no acute distress.  Neuro/Psych: Alert and oriented x3, extra-ocular muscles intact, able to move all 4 extremities, sensation grossly intact. Skin: Warm and dry, no rashes noted.  Respiratory: Not using accessory muscles, speaking in full sentences, trachea midline.  Cardiovascular: Pulses palpable, no extremity edema. Abdomen: Does not appear distended. Left Foot: Visibly swollen with only mild bruising. Tender to palpation over the anterior process of the calcaneus laterally. Range of motion is full in all directions. Strength is 5/5 in all directions. No hallux valgus. No pes cavus or pes planus. No abnormal callus noted. No pain over the navicular prominence, or base of fifth metatarsal. No tenderness to palpation of the calcaneal insertion of  plantar fascia. No pain at the Achilles insertion. No pain over the calcaneal bursa. No pain of the retrocalcaneal bursa. No tenderness to palpation over the tarsals, metatarsals, or phalanges. No hallux rigidus or limitus. No tenderness palpation over interphalangeal joints. No pain with compression of the metatarsal heads. Neurovascularly intact distally.  Impression and Recommendations:   This case required medical decision making of moderate complexity.

## 2014-08-13 NOTE — Assessment & Plan Note (Signed)
Continue boot. Refilling pain medication. Return in 4 weeks. Continue boot for 3 weeks.  I billed a fracture code for this encounter, all subsequent visits will be post-op checks in the global period.

## 2014-09-10 ENCOUNTER — Ambulatory Visit (INDEPENDENT_AMBULATORY_CARE_PROVIDER_SITE_OTHER): Payer: Self-pay | Admitting: Sports Medicine

## 2014-09-10 ENCOUNTER — Encounter: Payer: Self-pay | Admitting: Sports Medicine

## 2014-09-10 VITALS — BP 142/70 | HR 84 | Ht 66.0 in | Wt 247.0 lb

## 2014-09-10 DIAGNOSIS — S92002A Unspecified fracture of left calcaneus, initial encounter for closed fracture: Secondary | ICD-10-CM

## 2014-09-10 NOTE — Assessment & Plan Note (Signed)
Left lumbar radiculopathy likely from use of the boot. I do expect complete resolution within a week of transitioning into a regular shoe. Return if not better at that time. I have also given her some lumbar spine rehabilitation exercises to help.

## 2014-09-10 NOTE — Progress Notes (Signed)
  Subjective: Has now been in boot immobilization for 4 weeks, she did walk on the foot for 3-4 weeks before that. Currently she is pain free with regards to her foot, she has developed some radicular type symptoms since using the boot.  Objective: General: Well-developed, well-nourished, and in no acute distress. Left Foot: No visible erythema or swelling. Range of motion is full in all directions. Strength is 5/5 in all directions. No hallux valgus. No pes cavus or pes planus. No abnormal callus noted. No pain over the navicular prominence, or base of fifth metatarsal. No tenderness to palpation of the calcaneal insertion of plantar fascia. No pain at the Achilles insertion. No pain over the calcaneal bursa. No pain of the retrocalcaneal bursa. No tenderness to palpation over the tarsals, metatarsals, or phalanges. No hallux rigidus or limitus. No tenderness palpation over interphalangeal joints. No pain with compression of the metatarsal heads. Neurovascularly intact distally.  Assessment/plan:

## 2014-09-17 ENCOUNTER — Other Ambulatory Visit: Payer: Self-pay | Admitting: Physician Assistant

## 2014-10-16 ENCOUNTER — Encounter: Payer: Self-pay | Admitting: Physician Assistant

## 2014-10-16 ENCOUNTER — Ambulatory Visit (INDEPENDENT_AMBULATORY_CARE_PROVIDER_SITE_OTHER): Payer: BLUE CROSS/BLUE SHIELD | Admitting: Physician Assistant

## 2014-10-16 VITALS — BP 154/75 | HR 78 | Ht 66.0 in | Wt 242.0 lb

## 2014-10-16 DIAGNOSIS — J0100 Acute maxillary sinusitis, unspecified: Secondary | ICD-10-CM

## 2014-10-16 DIAGNOSIS — N898 Other specified noninflammatory disorders of vagina: Secondary | ICD-10-CM

## 2014-10-16 LAB — WET PREP FOR TRICH, YEAST, CLUE
Clue Cells Wet Prep HPF POC: NONE SEEN
Trich, Wet Prep: NONE SEEN
Yeast Wet Prep HPF POC: NONE SEEN

## 2014-10-16 MED ORDER — AZITHROMYCIN 250 MG PO TABS: ORAL_TABLET | ORAL | Status: DC

## 2014-10-16 NOTE — Patient Instructions (Signed)
Sinusitis Sinusitis is redness, soreness, and inflammation of the paranasal sinuses. Paranasal sinuses are air pockets within the bones of your face (beneath the eyes, the middle of the forehead, or above the eyes). In healthy paranasal sinuses, mucus is able to drain out, and air is able to circulate through them by way of your nose. However, when your paranasal sinuses are inflamed, mucus and air can become trapped. This can allow bacteria and other germs to grow and cause infection. Sinusitis can develop quickly and last only a short time (acute) or continue over a long period (chronic). Sinusitis that lasts for more than 12 weeks is considered chronic.  CAUSES  Causes of sinusitis include:  Allergies.  Structural abnormalities, such as displacement of the cartilage that separates your nostrils (deviated septum), which can decrease the air flow through your nose and sinuses and affect sinus drainage.  Functional abnormalities, such as when the small hairs (cilia) that line your sinuses and help remove mucus do not work properly or are not present. SIGNS AND SYMPTOMS  Symptoms of acute and chronic sinusitis are the same. The primary symptoms are pain and pressure around the affected sinuses. Other symptoms include:  Upper toothache.  Earache.  Headache.  Bad breath.  Decreased sense of smell and taste.  A cough, which worsens when you are lying flat.  Fatigue.  Fever.  Thick drainage from your nose, which often is green and may contain pus (purulent).  Swelling and warmth over the affected sinuses. DIAGNOSIS  Your health care provider will perform a physical exam. During the exam, your health care provider may:  Look in your nose for signs of abnormal growths in your nostrils (nasal polyps).  Tap over the affected sinus to check for signs of infection.  View the inside of your sinuses (endoscopy) using an imaging device that has a light attached (endoscope). If your health  care provider suspects that you have chronic sinusitis, one or more of the following tests may be recommended:  Allergy tests.  Nasal culture. A sample of mucus is taken from your nose, sent to a lab, and screened for bacteria.  Nasal cytology. A sample of mucus is taken from your nose and examined by your health care provider to determine if your sinusitis is related to an allergy. TREATMENT  Most cases of acute sinusitis are related to a viral infection and will resolve on their own within 10 days. Sometimes medicines are prescribed to help relieve symptoms (pain medicine, decongestants, nasal steroid sprays, or saline sprays).  However, for sinusitis related to a bacterial infection, your health care provider will prescribe antibiotic medicines. These are medicines that will help kill the bacteria causing the infection.  Rarely, sinusitis is caused by a fungal infection. In theses cases, your health care provider will prescribe antifungal medicine. For some cases of chronic sinusitis, surgery is needed. Generally, these are cases in which sinusitis recurs more than 3 times per year, despite other treatments. HOME CARE INSTRUCTIONS   Drink plenty of water. Water helps thin the mucus so your sinuses can drain more easily.  Use a humidifier.  Inhale steam 3 to 4 times a day (for example, sit in the bathroom with the shower running).  Apply a warm, moist washcloth to your face 3 to 4 times a day, or as directed by your health care provider.  Use saline nasal sprays to help moisten and clean your sinuses.  Take medicines only as directed by your health care provider.    If you were prescribed either an antibiotic or antifungal medicine, finish it all even if you start to feel better. SEEK IMMEDIATE MEDICAL CARE IF:  You have increasing pain or severe headaches.  You have nausea, vomiting, or drowsiness.  You have swelling around your face.  You have vision problems.  You have a stiff  neck.  You have difficulty breathing. MAKE SURE YOU:   Understand these instructions.  Will watch your condition.  Will get help right away if you are not doing well or get worse. Document Released: 08/24/2005 Document Revised: 01/08/2014 Document Reviewed: 09/08/2011 ExitCare Patient Information 2015 [employer]. This information is not intended to replace advice given to you by your health care provider. Make sure you discuss any questions you have with your health care provider.  Bacterial Vaginosis Bacterial vaginosis is a vaginal infection that occurs when the normal balance of bacteria in the vagina is disrupted. It results from an overgrowth of certain bacteria. This is the most common vaginal infection in women of childbearing age. Treatment is important to prevent complications, especially in pregnant women, as it can cause a premature delivery. CAUSES  Bacterial vaginosis is caused by an increase in harmful bacteria that are normally present in smaller amounts in the vagina. Several different kinds of bacteria can cause bacterial vaginosis. However, the reason that the condition develops is not fully understood. RISK FACTORS Certain activities or behaviors can put you at an increased risk of developing bacterial vaginosis, including:  Having a new sex partner or multiple sex partners.  Douching.  Using an intrauterine device (IUD) for contraception. Women do not get bacterial vaginosis from toilet seats, bedding, swimming pools, or contact with objects around them. SIGNS AND SYMPTOMS  Some women with bacterial vaginosis have no signs or symptoms. Common symptoms include:  Grey vaginal discharge.  A fishlike odor with discharge, especially after sexual intercourse.  Itching or burning of the vagina and vulva.  Burning or pain with urination. DIAGNOSIS  Your health care provider will take a medical history and examine the vagina for signs of bacterial vaginosis. A  sample of vaginal fluid may be taken. Your health care provider will look at this sample under a microscope to check for bacteria and abnormal cells. A vaginal pH test may also be done.  TREATMENT  Bacterial vaginosis may be treated with antibiotic medicines. These may be given in the form of a pill or a vaginal cream. A second round of antibiotics may be prescribed if the condition comes back after treatment.  HOME CARE INSTRUCTIONS   Only take over-the-counter or prescription medicines as directed by your health care provider.  If antibiotic medicine was prescribed, take it as directed. Make sure you finish it even if you start to feel better.  Do not have sex until treatment is completed.  Tell all sexual partners that you have a vaginal infection. They should see their health care provider and be treated if they have problems, such as a mild rash or itching.  Practice safe sex by using condoms and only having one sex partner. SEEK MEDICAL CARE IF:   Your symptoms are not improving after 3 days of treatment.  You have increased discharge or pain.  You have a fever. MAKE SURE YOU:   Understand these instructions.  Will watch your condition.  Will get help right away if you are not doing well or get worse. FOR MORE INFORMATION  Centers for Disease Control and Prevention, Division of STD Prevention:   www.cdc.gov/std American Sexual Health Association (ASHA): www.ashastd.org  Document Released: 08/24/2005 Document Revised: 06/14/2013 Document Reviewed: 04/05/2013 ExitCare Patient Information 2015 [employer]. This information is not intended to replace advice given to you by your health care provider. Make sure you discuss any questions you have with your health care provider.  

## 2014-10-16 NOTE — Progress Notes (Signed)
   Subjective:    Patient ID: Hayley Porter, female    DOB: 08/04/1951, 64 y.o.   MRN: [id number]  HPI Pt presents to the clinic left sided facial pressure, left ear pain for last 6 days. She has had a dry cough. No fever. Trying mucinex and cough syrup. Little relief. Some nasal congestion.   Hx of BV. Last episode last year. She admits to douching. Vaginal discharge started a few days ago with a light odor.    Review of Systems     Objective:   Physical Exam  Constitutional: She is oriented to person, place, and time. She appears well-developed and well-nourished.  HENT:  Head: Normocephalic and atraumatic.  Right Ear: External ear normal.  Left Ear: External ear normal.  TM's clear bilaterally.  Oropharynx erythematous with PND.  Turbinates red and swollen.  Left sided maxillary facial pressure to palpation.   Eyes: Conjunctivae are normal. Right eye exhibits no discharge. Left eye exhibits no discharge.  Neck: Normal range of motion. Neck supple.  Cardiovascular: Normal rate, regular rhythm and normal heart sounds.   Pulmonary/Chest: Effort normal and breath sounds normal. She has no wheezes.  Lymphadenopathy:    She has no cervical adenopathy.  Neurological: She is alert and oriented to person, place, and time.  Skin: Skin is dry.  Psychiatric: She has a normal mood and affect. Her behavior is normal.          Assessment & Plan:  Acute maxillary sinusitis- treated with zpak. Continue flonase. HO given. Continue mucinex. Follow up as needed.   Vaginal discharge- likely BV. Stat wet prep done today. Gave HO for prevention. Discussed how douching can make worse. Pt mentions gyn referral. I do not feel she needs this at this time. Only happening once a year. Can treat that often.

## 2014-10-25 ENCOUNTER — Telehealth: Payer: Self-pay | Admitting: *Deleted

## 2014-10-25 NOTE — Telephone Encounter (Signed)
Pt left vm stating that she has finished her abx but her sx are returning.  She wants to know if you need her to come back in or if she needs another round of abx.  Please advise.

## 2014-10-26 ENCOUNTER — Other Ambulatory Visit: Payer: Self-pay | Admitting: *Deleted

## 2014-10-26 MED ORDER — PREDNISONE 50 MG PO TABS: ORAL_TABLET | ORAL | Status: DC

## 2014-10-26 NOTE — Telephone Encounter (Signed)
Pt called back & is ok with prednisone.  50mg  1 daily for 5 days?

## 2014-10-26 NOTE — Telephone Encounter (Signed)
Rx sent to pharm.  Pt notified. 

## 2014-10-26 NOTE — Telephone Encounter (Signed)
LMOM for pt to return call. 

## 2014-10-26 NOTE — Telephone Encounter (Signed)
Yes

## 2014-10-26 NOTE — Telephone Encounter (Signed)
Call pt: could call in some prednisone to help with residual inflammation. Would you like this?

## 2014-11-22 ENCOUNTER — Other Ambulatory Visit: Payer: Self-pay | Admitting: Physician Assistant

## 2014-11-22 MED ORDER — TEMAZEPAM 30 MG PO CAPS: 30.0000 mg | ORAL_CAPSULE | Freq: Every evening | ORAL | Status: DC | PRN

## 2014-11-27 ENCOUNTER — Encounter: Payer: Self-pay | Admitting: Physician Assistant

## 2014-11-27 ENCOUNTER — Other Ambulatory Visit: Payer: Self-pay | Admitting: Physician Assistant

## 2014-11-27 ENCOUNTER — Ambulatory Visit (INDEPENDENT_AMBULATORY_CARE_PROVIDER_SITE_OTHER): Payer: BLUE CROSS/BLUE SHIELD | Admitting: Physician Assistant

## 2014-11-27 VITALS — BP 155/82 | HR 78 | Wt 244.0 lb

## 2014-11-27 DIAGNOSIS — Z131 Encounter for screening for diabetes mellitus: Secondary | ICD-10-CM

## 2014-11-27 DIAGNOSIS — J302 Other seasonal allergic rhinitis: Secondary | ICD-10-CM

## 2014-11-27 DIAGNOSIS — J4530 Mild persistent asthma, uncomplicated: Secondary | ICD-10-CM

## 2014-11-27 DIAGNOSIS — H1013 Acute atopic conjunctivitis, bilateral: Secondary | ICD-10-CM

## 2014-11-27 DIAGNOSIS — J3489 Other specified disorders of nose and nasal sinuses: Secondary | ICD-10-CM

## 2014-11-27 DIAGNOSIS — Z0000 Encounter for general adult medical examination without abnormal findings: Secondary | ICD-10-CM | POA: Diagnosis not present

## 2014-11-27 DIAGNOSIS — F329 Major depressive disorder, single episode, unspecified: Secondary | ICD-10-CM

## 2014-11-27 DIAGNOSIS — G4700 Insomnia, unspecified: Secondary | ICD-10-CM | POA: Diagnosis not present

## 2014-11-27 DIAGNOSIS — E785 Hyperlipidemia, unspecified: Secondary | ICD-10-CM

## 2014-11-27 DIAGNOSIS — F32A Depression, unspecified: Secondary | ICD-10-CM

## 2014-11-27 DIAGNOSIS — H1010 Acute atopic conjunctivitis, unspecified eye: Secondary | ICD-10-CM | POA: Insufficient documentation

## 2014-11-27 LAB — LIPID PANEL
CHOLESTEROL: 151 mg/dL (ref 0–200)
HDL: 37 mg/dL — ABNORMAL LOW (ref >=46)
LDL Cholesterol: 77 mg/dL (ref 0–99)
Total CHOL/HDL Ratio: 4.1 Ratio
Triglycerides: 185 mg/dL — ABNORMAL HIGH (ref <150)
VLDL: 37 mg/dL (ref 0–40)

## 2014-11-27 LAB — COMPLETE METABOLIC PANEL WITH GFR
ALK PHOS: 79 U/L (ref 39–117)
ALT: 38 U/L — AB (ref 0–35)
AST: 35 U/L (ref 0–37)
Albumin: 4.6 g/dL (ref 3.5–5.2)
BILIRUBIN TOTAL: 0.7 mg/dL (ref 0.2–1.2)
BUN: 11 mg/dL (ref 6–23)
CALCIUM: 9.8 mg/dL (ref 8.4–10.5)
CO2: 29 meq/L (ref 19–32)
CREATININE: 0.73 mg/dL (ref 0.50–1.10)
Chloride: 102 mEq/L (ref 96–112)
GFR, Est African American: >89 mL/min
GFR, Est Non African American: 89 mL/min
Glucose, Bld: 110 mg/dL — ABNORMAL HIGH (ref 70–99)
Potassium: 4.5 mEq/L (ref 3.5–5.3)
SODIUM: 142 meq/L (ref 135–145)
Total Protein: 7.1 g/dL (ref 6.0–8.3)

## 2014-11-27 MED ORDER — NEBIVOLOL HCL 10 MG PO TABS: 10.0000 mg | ORAL_TABLET | Freq: Every day | ORAL | Status: AC

## 2014-11-27 MED ORDER — AZELASTINE HCL 0.05 % OP SOLN
1.0000 [drp] | Freq: Two times a day (BID) | OPHTHALMIC | Status: AC
Start: 2014-11-27 — End: ?

## 2014-11-27 MED ORDER — FLUTICASONE PROPIONATE HFA 110 MCG/ACT IN AERO: 2.0000 | INHALATION_SPRAY | Freq: Two times a day (BID) | RESPIRATORY_TRACT | Status: AC

## 2014-11-27 MED ORDER — MONTELUKAST SODIUM 10 MG PO TABS: 10.0000 mg | ORAL_TABLET | Freq: Every day | ORAL | Status: AC

## 2014-11-27 MED ORDER — TEMAZEPAM 30 MG PO CAPS: 30.0000 mg | ORAL_CAPSULE | Freq: Every evening | ORAL | Status: AC | PRN

## 2014-11-27 MED ORDER — LEVETIRACETAM 500 MG PO TABS: 500.0000 mg | ORAL_TABLET | Freq: Two times a day (BID) | ORAL | Status: AC

## 2014-11-27 MED ORDER — OLMESARTAN-AMLODIPINE-HCTZ 40-5-25 MG PO TABS: ORAL_TABLET | ORAL | Status: AC

## 2014-11-27 MED ORDER — BECLOMETHASONE DIPROPIONATE 80 MCG/ACT NA AERS: INHALATION_SPRAY | NASAL | Status: AC

## 2014-11-27 MED ORDER — SERTRALINE HCL 100 MG PO TABS: ORAL_TABLET | ORAL | Status: AC

## 2014-11-27 NOTE — Patient Instructions (Signed)
Will schedule CT scan of sinuses.   Keeping You Healthy  Get These Tests  Blood Pressure- Have your blood pressure checked by your healthcare provider at least once a year.  Normal blood pressure is 120/80.  Weight- Have your body mass index (BMI) calculated to screen for obesity.  BMI is a measure of body fat based on height and weight.  You can calculate your own BMI at https://www.west-esparza.com/www.[email or web address]/bmi/  Cholesterol- Have your cholesterol checked every year.  Diabetes- Have your blood sugar checked every year if you have high blood pressure, high cholesterol, a family history of diabetes or if you are overweight.  Pap Smear- Have a pap smear every 1 to 3 years if you have been sexually active.  If you are older than 65 and recent pap smears have been normal you may not need additional pap smears.  In addition, if you have had a hysterectomy  For benign disease additional pap smears are not necessary.  Mammogram-Yearly mammograms are essential for early detection of breast cancer  Screening for Colon Cancer- Colonoscopy starting at age 62. Screening may begin sooner depending on your family history and other health conditions.  Follow up colonoscopy as directed by your Gastroenterologist.  Screening for Osteoporosis- Screening begins at age 765 with bone density scanning, sooner if you are at higher risk for developing Osteoporosis.  Get these medicines  Calcium with Vitamin D- Your body requires 1200-1500 mg of Calcium a day and [phone] IU of Vitamin D a day.  You can only absorb 500 mg of Calcium at a time therefore Calcium must be taken in 2 or 3 separate doses throughout the day.  Hormones- Hormone therapy has been associated with increased risk for certain cancers and heart disease.  Talk to your healthcare provider about if you need relief from menopausal symptoms.  Aspirin- Ask your healthcare provider about taking Aspirin to prevent Heart Disease and Stroke.  Get these  Immuniztions  Flu shot- Every fall  Pneumonia shot- Once after the age of 62; if you are younger ask your healthcare provider if you need a pneumonia shot.  Tetanus- Every ten years.  Zostavax- Once after the age of 62 to prevent shingles.  Take these steps  Don't smoke- Your healthcare provider can help you quit. For tips on how to quit, ask your healthcare provider or go to www.smokefree.gov or call 1-800 QUIT-NOW.  Be physically active- Exercise 5 days a week for a minimum of 30 minutes.  If you are not already physically active, start slow and gradually work up to 30 minutes of moderate physical activity.  Try walking, dancing, bike riding, swimming, etc.  Eat a healthy diet- Eat a variety of healthy foods such as fruits, vegetables, whole grains, low fat milk, low fat cheeses, yogurt, lean meats, chicken, fish, eggs, dried beans, tofu, etc.  For more information go to www.thenutritionsource.org  Dental visit- Brush and floss teeth twice daily; visit your dentist twice a year.  Eye exam- Visit your Optometrist or Ophthalmologist yearly.  Drink alcohol in moderation- Limit alcohol intake to one drink or less a day.  Never drink and drive.  Depression- Your emotional health is as important as your physical health.  If you're feeling down or losing interest in things you normally enjoy, please talk to your healthcare provider.  Seat Belts- can save your life; always wear one  Smoke/Carbon Monoxide detectors- These detectors need to be installed on the appropriate level of your home.  Replace batteries at  least once a year.  Violence- If anyone is threatening or hurting you, please tell your healthcare provider.  Living Will/ Health care power of attorney- Discuss with your healthcare provider and family.

## 2014-11-27 NOTE — Progress Notes (Signed)
Subjective:     Hayley Porter is a 64 y.o. female and is here for a comprehensive physical exam. The patient reports problems - Patient is having a considerable amount of recurrent sinus pressure, ear pain, nasal congestion, headache, shortness of breath and wheezing. She admits to being out of Flovent, every nasal and Singulair. She has a lot of eye itching and dry cough. She recently has been treated with Augmentin, Z-Pak and prednisone with relief each time but symptoms reoccurring.Marland Kitchen.  History   Social History  . Marital Status: Single    Spouse Name: N/A  . Number of Children: N/A  . Years of Education: N/A   Occupational History  . Not on file.   Social History Main Topics  . Smoking status: Former Smoker    Quit date: 09/19/1993  . Smokeless tobacco: Never Used  . Alcohol Use: No  . Drug Use: No  . Sexual Activity: No   Other Topics Concern  . Not on file   Social History Narrative   Health Maintenance  Topic Date Due  . HIV Screening  01/08/1968  . PAP SMEAR  09/13/2038 (Originally 01/08/1971)  . INFLUENZA VACCINE  04/08/2015  . COLONOSCOPY  09/08/2015  . MAMMOGRAM  10/31/2015  . TETANUS/TDAP  12/26/2023  . ZOSTAVAX  Completed    The following portions of the patient's history were reviewed and updated as appropriate: allergies, current medications, past family history, past medical history, past social history, past surgical history and problem list.  Review of Systems A comprehensive review of systems was negative.   Objective:    BP 155/82 mmHg  Pulse 78  Wt 244 lb (110.678 kg)  SpO2 100% General appearance: alert, cooperative and appears stated age Head: Normocephalic, without obvious abnormality, atraumatic Eyes: conjunctivae/corneas clear. PERRL, EOM's intact. Fundi benign. Ears: normal TM's and external ear canals both ears Nose: Tenderness over frontal and maxillary sinuses to palpation. Nasal turbinates are red and swollen bilaterally. TMs are  slightly altering with some dullness and fluid behind them. Throat: lips, mucosa, and tongue normal; teeth and gums normal Neck: no adenopathy, no carotid bruit, no JVD, supple, symmetrical, trachea midline and thyroid not enlarged, symmetric, no tenderness/mass/nodules Back: symmetric, no curvature. ROM normal. No CVA tenderness. Lungs: clear to auscultation bilaterally Heart: regular rate and rhythm, S1, S2 normal, no murmur, click, rub or gallop Abdomen: soft, non-tender; bowel sounds normal; no masses,  no organomegaly Extremities: extremities normal, atraumatic, no cyanosis or edema Pulses: 2+ and symmetric Skin: Skin color, texture, turgor normal. No rashes or lesions Lymph nodes: Cervical, supraclavicular, and axillary nodes normal. Neurologic: Grossly normal    Assessment:    Healthy female exam.       Plan:     CPE-patient is due for mammogram and they have sent her a letter she just needs to schedule. Colonoscopy is up-to-date. Vaccines are up-to-date. Fasting labs were ordered. Encouraged patient to continue on vitamin D and make sure having 4 servings of calcium a day.  Allergic rhinitis/seasonal allergies/allergic conjunctivitis/sinus pressure/asthma-there could certainly be some chronic sinusitis going on here. Would like to get a CT scan of sinuses. Patient has been on 2 antibiotics in the last 6 weeks. Since there is an allergic component. Qnasl was refilled for 2 sprays each nostril daily. Optivar was given for itchy eyes. Flovent was refilled for asthma and shortness of breath and cough. Discussed when you do spirometry reading in the future. She can schedule this at any time.  Hypertension-patient is  borderline for her age. Goal would be under 150/90. She is not feeling well today and has been taking some over-the-counter Robitussin. Will not make any changes today. Follow-up in 3-6 months.  Depression- PHQ-9 was 3. Refilled for 6 months.   Insomnia- refilled for 6  months. No concerns or problems.  See After Visit Summary for Counseling Recommendations

## 2014-11-28 ENCOUNTER — Encounter: Payer: Self-pay | Admitting: Physician Assistant

## 2014-11-28 DIAGNOSIS — R7301 Impaired fasting glucose: Secondary | ICD-10-CM | POA: Insufficient documentation

## 2014-12-03 ENCOUNTER — Ambulatory Visit (INDEPENDENT_AMBULATORY_CARE_PROVIDER_SITE_OTHER): Payer: BLUE CROSS/BLUE SHIELD

## 2014-12-03 DIAGNOSIS — J302 Other seasonal allergic rhinitis: Secondary | ICD-10-CM | POA: Diagnosis not present

## 2014-12-03 DIAGNOSIS — J3489 Other specified disorders of nose and nasal sinuses: Secondary | ICD-10-CM | POA: Diagnosis not present

## 2014-12-03 DIAGNOSIS — Z9889 Other specified postprocedural states: Secondary | ICD-10-CM | POA: Diagnosis not present

## 2014-12-28 ENCOUNTER — Other Ambulatory Visit: Payer: Self-pay | Admitting: Physician Assistant

## 2014-12-31 ENCOUNTER — Other Ambulatory Visit: Payer: Self-pay | Admitting: *Deleted

## 2014-12-31 MED ORDER — ROSUVASTATIN CALCIUM 10 MG PO TABS: 10.0000 mg | ORAL_TABLET | Freq: Every day | ORAL | Status: AC

## 2015-05-29 ENCOUNTER — Ambulatory Visit: Payer: BLUE CROSS/BLUE SHIELD | Admitting: Physician Assistant

## 2015-06-29 IMAGING — CR DG ANKLE COMPLETE 3+V*L*
3 series · 3 of 3 positions shown · non-contrast
Comparison: None.

CLINICAL DATA: Recent fall with left lateral malleolar pain and
swelling.

EXAM:
LEFT ANKLE COMPLETE - 3+ VIEW

[view not recorded (1 of 3)]
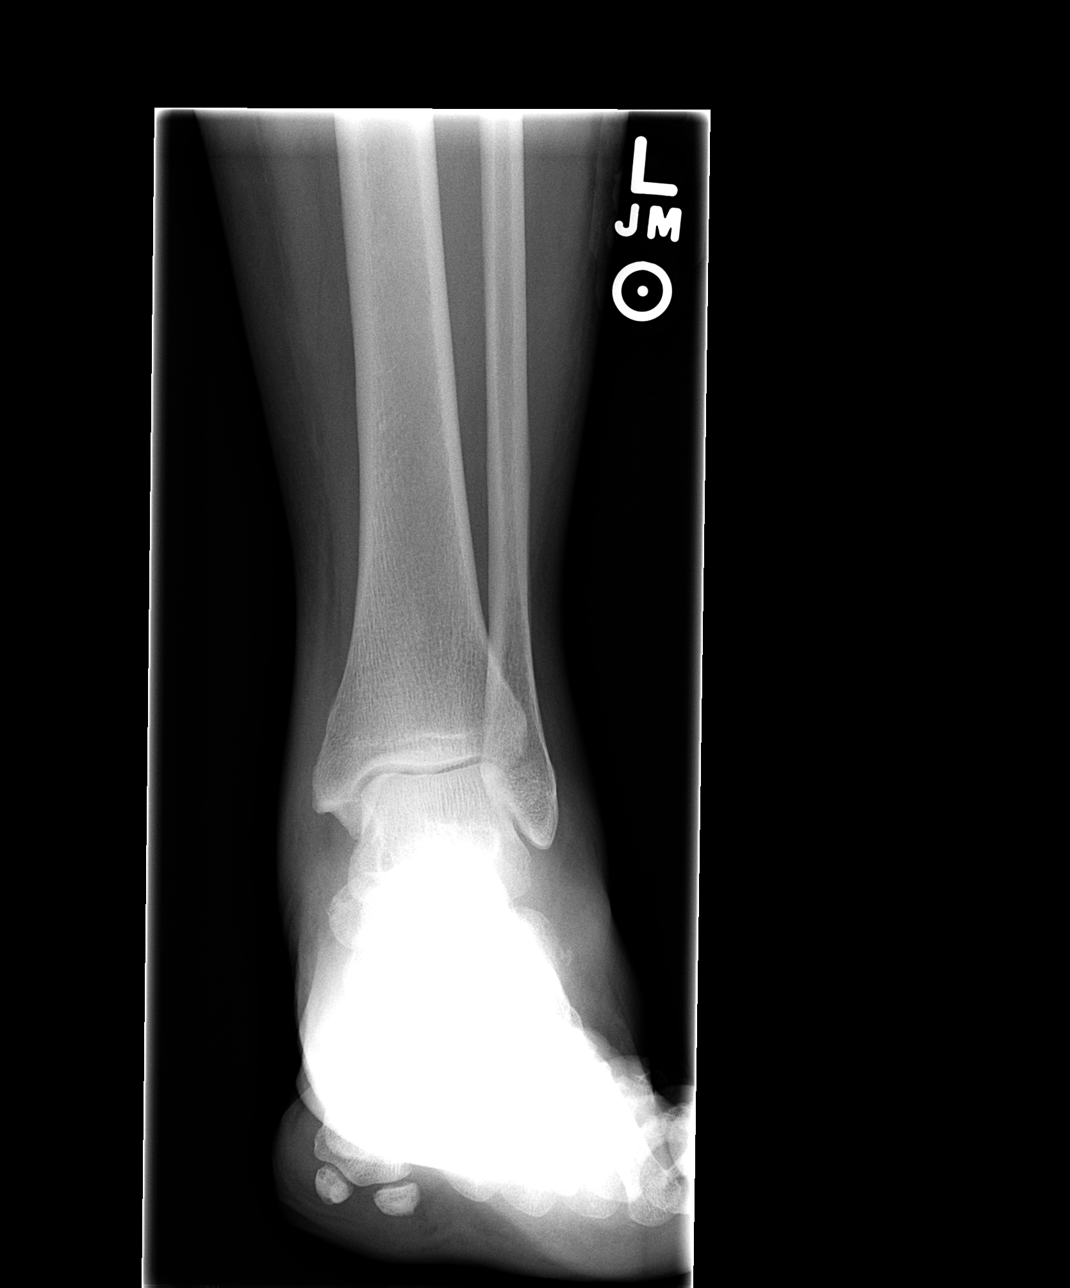

[view not recorded (2 of 3)]
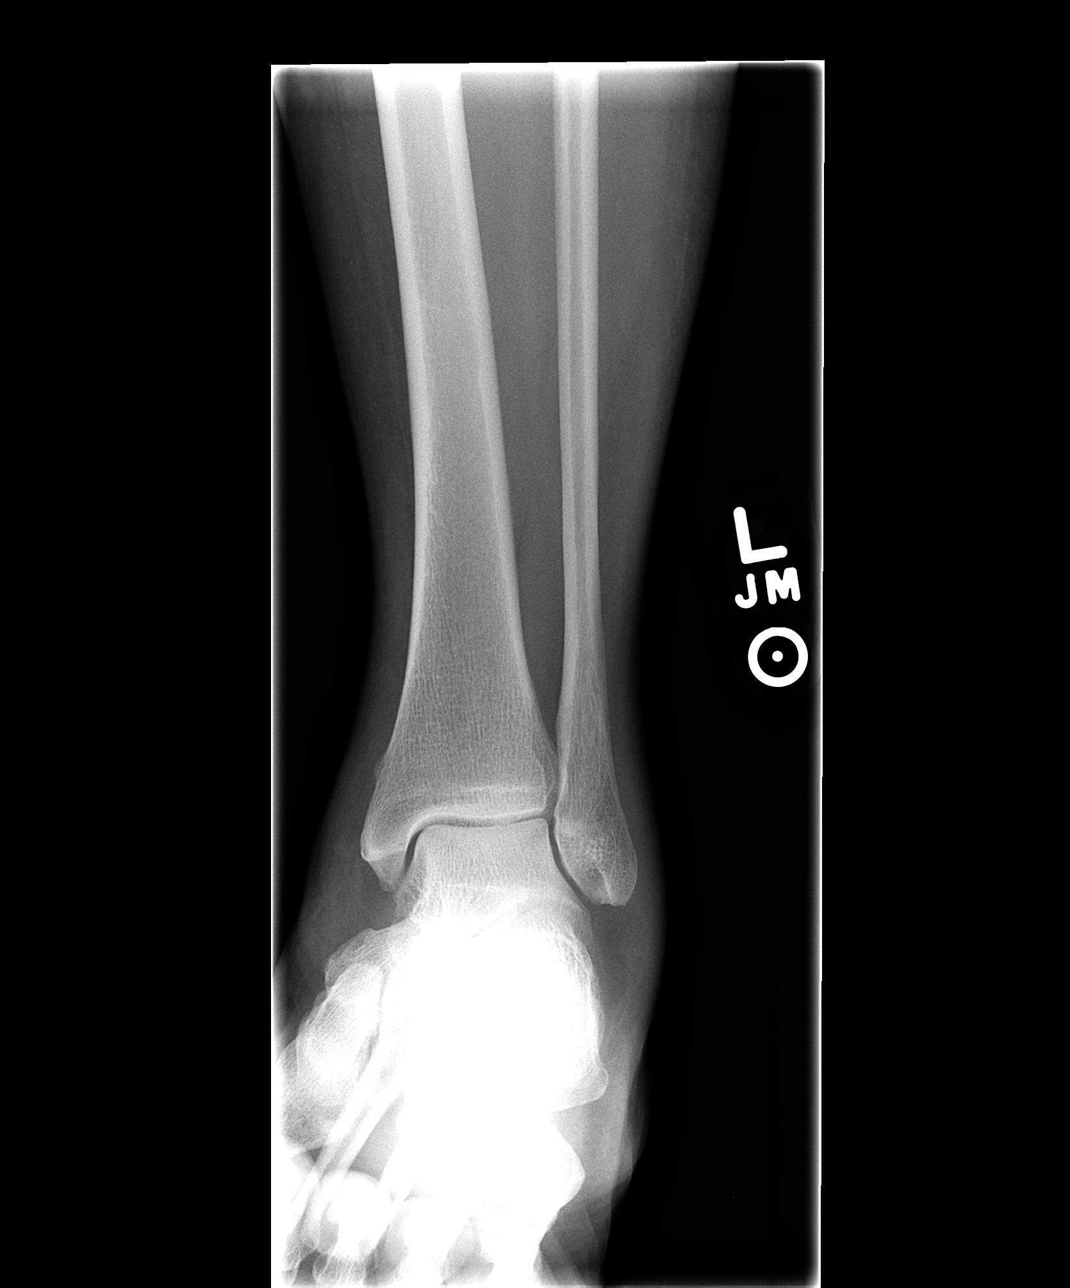

[view not recorded (3 of 3)]
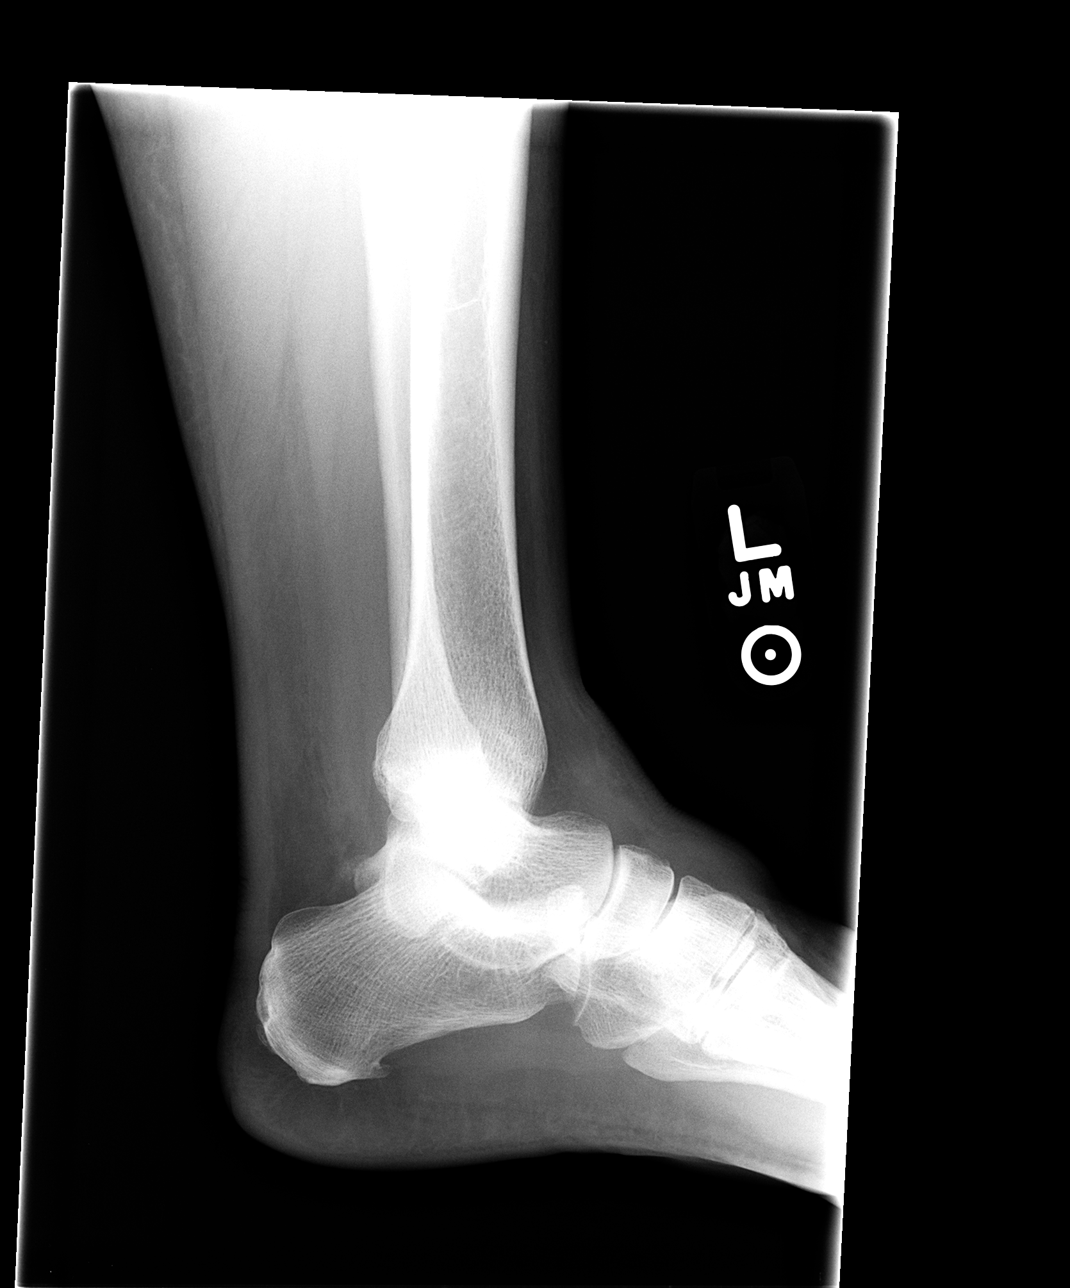

[3 of 3 positions shown; findings below may reference images not displayed]

FINDINGS: Diffuse soft tissue swelling. Ankle mortise is intact. There is
slight fragmentation along the lateral aspect of the foot, on the
frontal view only. No additional evidence of acute fracture.
IMPRESSION: Slight fragmentation along the lateral aspect of the foot, on the
frontal view, worrisome for a small avulsion fracture, possibly
arising from the calcaneus. Please correlate clinically for point
tenderness. Diffuse soft tissue swelling.
